# Patient Record
Sex: Male | Born: 1965 | Race: Black or African American | Hispanic: No | Marital: Single | Smoking: Light tobacco smoker
Health system: Southern US, Community
[De-identification: ages and names within clinical notes are randomized; demographics above are authoritative.]

## PROBLEM LIST (undated history)

## (undated) DIAGNOSIS — J45909 Unspecified asthma, uncomplicated: Secondary | ICD-10-CM

## (undated) DIAGNOSIS — N200 Calculus of kidney: Secondary | ICD-10-CM

## (undated) DIAGNOSIS — T7840XA Allergy, unspecified, initial encounter: Secondary | ICD-10-CM

## (undated) HISTORY — PX: LITHOTRIPSY: SUR834

## (undated) HISTORY — DX: Allergy, unspecified, initial encounter: T78.40XA

## (undated) HISTORY — PX: APPENDECTOMY: SHX54

## (undated) HISTORY — PX: ACHILLES TENDON SURGERY: SHX542

---

## 2010-07-25 ENCOUNTER — Emergency Department (HOSPITAL_COMMUNITY)
Admission: EM | Admit: 2010-07-25 | Discharge: 2010-07-25 | Disposition: A | Discharge status: Home / Self Care | Payer: BC Managed Care – PPO | Attending: Emergency Medicine | Admitting: Emergency Medicine

## 2010-07-25 DIAGNOSIS — Z202 Contact with and (suspected) exposure to infections with a predominantly sexual mode of transmission: Secondary | ICD-10-CM | POA: Insufficient documentation

## 2010-07-25 LAB — URINALYSIS, ROUTINE W REFLEX MICROSCOPIC
Bilirubin Urine: NEGATIVE
Hgb urine dipstick: NEGATIVE
Ketones, ur: NEGATIVE mg/dL
Nitrite: NEGATIVE
Urobilinogen, UA: 1 mg/dL (ref 0.0–1.0)

## 2010-07-25 LAB — URINE MICROSCOPIC-ADD ON

## 2010-07-28 LAB — GC/CHLAMYDIA PROBE AMP, URINE
Chlamydia, Swab/Urine, PCR: NEGATIVE
GC Probe Amp, Urine: NEGATIVE

## 2013-05-22 ENCOUNTER — Encounter: Payer: Self-pay | Admitting: Podiatrist

## 2013-05-22 ENCOUNTER — Ambulatory Visit (INDEPENDENT_AMBULATORY_CARE_PROVIDER_SITE_OTHER): Payer: PRIVATE HEALTH INSURANCE | Admitting: Podiatrist

## 2013-05-22 VITALS — BP 126/85 | HR 76 | Resp 17 | Ht 69.0 in | Wt 185.0 lb

## 2013-05-22 DIAGNOSIS — B351 Tinea unguium: Secondary | ICD-10-CM

## 2013-05-22 MED ORDER — TERBINAFINE HCL 250 MG PO TABS: 250.0000 mg | ORAL_TABLET | Freq: Every day | ORAL | Status: DC

## 2013-05-22 NOTE — Progress Notes (Signed)
   Subjective:    Patient ID: David Villarreal, male    DOB: 1965-06-03, 48 y.o.   MRN: [id number] Pt states has fungal toenails quit taking the medication when began to see improvement.  Pt has a corn to the left 5th toe, he states has been there since wearing improper shoes.  Pt has a hard painful callous to the bottom of his right 5th MPJ. HPI    Review of Systems  All other systems reviewed and are negative.       Objective:   Physical Exam GENERAL APPEARANCE: Alert, conversant. Appropriately groomed. No acute distress.  VASCULAR: Pedal pulses palpable at 2/4 DP and PT bilateral.  Capillary refill time is immediate to all digits,  Proximal to distal cooling it warm to warm.  Digital hair growth is present bilateral  NEUROLOGIC: sensation is intact epicritically and protectively to 5.07 monofilament at 5/5 sites bilateral.  Light touch is intact bilateral, vibratory sensation intact bilateral, achilles tendon reflex is intact bilateral.  MUSCULOSKELETAL: acceptable muscle strength, tone and stability bilateral.  Intrinsic muscluature intact bilateral.  Rectus appearance of foot and digits noted bilateral.   DERMATOLOGIC: Toenails one and 2 of bilateral feet are thickened, discolored, dystrophic, clinically mycotic, and uncomfortable. The patient relates that the toenail fungus completely resolved after the use of Lamisil in the past however he cannot recall if he took 30 days or 90 base of the medication. He also has a hyperkeratotic lesion submetatarsal 5 of the right foot.    Assessment & Plan:  Onychomycosis digits one and 2 bilateral   Plan: Recommended Lamisil again. He states he recently had his labs done and he will have the results faxed over. We will call when is safe for him to start taking the Lamisil again. I will see him back in 90 days for recheck and reevaluation of the toenail.

## 2013-05-22 NOTE — Patient Instructions (Signed)

## 2013-08-27 ENCOUNTER — Encounter: Payer: Self-pay | Admitting: Podiatrist

## 2013-08-27 ENCOUNTER — Ambulatory Visit (INDEPENDENT_AMBULATORY_CARE_PROVIDER_SITE_OTHER): Payer: PRIVATE HEALTH INSURANCE | Admitting: Podiatrist

## 2013-08-27 VITALS — BP 134/91 | HR 76 | Resp 18

## 2013-08-27 DIAGNOSIS — Z79899 Other long term (current) drug therapy: Secondary | ICD-10-CM

## 2013-08-27 DIAGNOSIS — B351 Tinea unguium: Secondary | ICD-10-CM

## 2013-08-27 MED ORDER — TERBINAFINE HCL 250 MG PO TABS: 250.0000 mg | ORAL_TABLET | Freq: Every day | ORAL | Status: AC

## 2013-08-27 NOTE — Patient Instructions (Signed)
Take the lamisil tabs for 1 more month longer-- watch the toenails as they grow and if they are not clear in 3-6 months, call and let me know.  They take a full year to grow out completely before we know for sure if the medication worked.

## 2013-08-27 NOTE — Progress Notes (Signed)
I DON'T THINK THAT THE LAMISIL IS WORKING AS GOOD BUT THEY ARE BETTER THAN THEY WERE  Patient presents for follow up of fungal nails 1,2 bilateral.  He has taken lamisil with some noticeable improvement.  He has only been on the medication for 3 months but states he's noticed improvement. He also has pain on left fifth digit from a corn  Objective: Neurovascular status is intact. Digital nails 1 into bilateral to appear to be growing out at the very proximal margin of the nail. Left fifth digit does have a digital corn which is symptomatic and painful to the patient.  Assessment: Onychomycosis, corn fifth toe left  Plan: Recommended continuation of Lamisil for one extra month. Labs were ordered and given to the patient to have done today. Also discussed the corn on the fifth digit left dispensed a pad in told him that if this does not help with the pain surgery would be recommended to straighten out the toe. He will call of the toenails do not improve in the next 3-6 months.

## 2013-08-28 LAB — HEPATIC FUNCTION PANEL
ALBUMIN: 4.5 g/dL (ref 3.5–5.5)
ALK PHOS: 68 IU/L (ref 39–117)
ALT: 12 IU/L (ref 0–44)
AST: 15 IU/L (ref 0–40)
Bilirubin, Direct: 0.07 mg/dL (ref 0.00–0.40)
Total Bilirubin: 0.3 mg/dL (ref 0.0–1.2)
Total Protein: 6.6 g/dL (ref 6.0–8.5)

## 2013-08-29 ENCOUNTER — Telehealth: Payer: Self-pay | Admitting: *Deleted

## 2013-08-29 NOTE — Telephone Encounter (Signed)
Message copied by Emilia BeckPARRY, MELODY A on Wed Aug 29, 2013  1:49 PM ------      Message from: Delories HeinzEGERTON, KATHRYN P      Created: Wed Aug 29, 2013  1:03 PM      Regarding: labs great-- finish out lamisil       Please tell him the labs are great-- ok to finish lamisil.      thanks      ----- Message -----         From: [facility] Lab Results In Interface         Sent: 08/28/2013   5:42 AM           To: Marlowe AschoffKathryn Egerton, DPM                   ------

## 2013-09-04 ENCOUNTER — Ambulatory Visit: Payer: PRIVATE HEALTH INSURANCE | Admitting: Podiatrist

## 2014-06-06 ENCOUNTER — Other Ambulatory Visit: Payer: Self-pay | Admitting: Orthopedic Surgery

## 2014-06-12 ENCOUNTER — Encounter (HOSPITAL_COMMUNITY): Payer: Self-pay | Admitting: *Deleted

## 2014-06-12 MED ORDER — POVIDONE-IODINE 7.5 % EX SOLN
Freq: Once | CUTANEOUS | Status: DC
  Filled 2014-06-12: qty 118

## 2014-06-12 MED ORDER — CEFAZOLIN SODIUM-DEXTROSE 2-3 GM-% IV SOLR
2.0000 g | INTRAVENOUS | Status: AC
  Administered 2014-06-13 (×2): 2 g via INTRAVENOUS

## 2014-06-12 NOTE — H&P (Signed)
     PREOPERATIVE H&P  Chief Complaint: Right leg pain  HPI: David Villarreal is a 49 y.o. male who presents with ongoing pain in the right leg  MRI reveals severe stenosis at L5/S1 as well as a pars defect  Patient has failed multiple forms of conservative care and continues to have pain (see office notes for additional details regarding the patient's full course of treatment)  Past Medical History  Diagnosis Date  . Allergy   . Stone in kidney   . Asthma     PMH; as a child only   Past Surgical History  Procedure Laterality Date  . Appendectomy    . Achilles tendon surgery Left   . Lithotripsy     History   Social History  . Marital Status: Single    Spouse Name: N/A  . Number of Children: N/A  . Years of Education: N/A   Social History Main Topics  . Smoking status: Light Tobacco Smoker -- 0.25 packs/day    Types: Cigarettes  . Smokeless tobacco: Not on file  . Alcohol Use: No  . Drug Use: No  . Sexual Activity: Not on file   Other Topics Concern  . None   Social History Narrative   Family History  Problem Relation Age of Onset  . Hypertension Mother   . Asthma Mother   . Lupus Mother   . Hypertension Father    No Known Allergies Prior to Admission medications   Medication Sig Start Date End Date Taking? Authorizing Provider  LYRICA 50 MG capsule Take 1 capsule by mouth 2 (two) times daily. 05/10/14  Yes Historical Provider, MD  terbinafine (LAMISIL) 250 MG tablet Take 1 tablet (250 mg total) by mouth daily. Patient not taking: Reported on 06/10/2014 08/27/13   Delories HeinzKathryn P Egerton, DPM     All other systems have been reviewed and were otherwise negative with the exception of those mentioned in the HPI and as above.  Physical Exam: There were no vitals filed for this visit.  General: Alert, no acute distress Cardiovascular: No pedal edema Respiratory: No cyanosis, no use of accessory musculature Skin: No lesions in the area of chief complaint Neurologic:  Sensation intact distally Psychiatric: Patient is competent for consent with normal mood and affect Lymphatic: No axillary or cervical lymphadenopathy  MUSCULOSKELETAL: + SLR on right  Assessment/Plan: Right leg pain Plan for Procedure(s): POSTERIOR LUMBAR FUSION AND DECOMPRESSION 1 LEVEL   Emilee HeroUMONSKI,Larsen Zettel LEONARD, MD 06/12/2014 9:20 PM

## 2014-06-12 NOTE — Progress Notes (Signed)
Pt denies SOB, chest pain, and being under the care of a cardiologist. Pt denies having a stress test, echo and cardiac cath. Pt denies having a chest x ray and EKG within the last year. Pt made aware to stop Aspirin, otc vitamins, NSAIDs and herbal medications. Pt verbalized understanding of all pre-op instructions.

## 2014-06-13 ENCOUNTER — Inpatient Hospital Stay (HOSPITAL_COMMUNITY)
Admission: RE | Admit: 2014-06-13 | Discharge: 2014-06-17 | DRG: 460 | Disposition: A | Discharge status: Home Health | Payer: Worker's Compensation | Source: Ambulatory Visit | Attending: Orthopedic Surgery | Admitting: Orthopedic Surgery

## 2014-06-13 ENCOUNTER — Encounter (HOSPITAL_COMMUNITY): Payer: Self-pay | Admitting: *Deleted

## 2014-06-13 ENCOUNTER — Encounter (HOSPITAL_COMMUNITY)
Admission: RE | Disposition: A | Discharge status: Home Health | Payer: Worker's Compensation | Source: Ambulatory Visit | Attending: Orthopedic Surgery

## 2014-06-13 ENCOUNTER — Inpatient Hospital Stay (HOSPITAL_COMMUNITY): Payer: Worker's Compensation

## 2014-06-13 ENCOUNTER — Other Ambulatory Visit: Payer: Self-pay

## 2014-06-13 ENCOUNTER — Inpatient Hospital Stay (HOSPITAL_COMMUNITY): Payer: Worker's Compensation | Admitting: Anesthesiology

## 2014-06-13 DIAGNOSIS — Z79899 Other long term (current) drug therapy: Secondary | ICD-10-CM | POA: Diagnosis not present

## 2014-06-13 DIAGNOSIS — M5416 Radiculopathy, lumbar region: Secondary | ICD-10-CM | POA: Diagnosis present

## 2014-06-13 DIAGNOSIS — M4807 Spinal stenosis, lumbosacral region: Secondary | ICD-10-CM | POA: Diagnosis present

## 2014-06-13 DIAGNOSIS — M79604 Pain in right leg: Secondary | ICD-10-CM | POA: Diagnosis present

## 2014-06-13 DIAGNOSIS — M5410 Radiculopathy, site unspecified: Secondary | ICD-10-CM | POA: Diagnosis present

## 2014-06-13 DIAGNOSIS — Z419 Encounter for procedure for purposes other than remedying health state, unspecified: Secondary | ICD-10-CM

## 2014-06-13 DIAGNOSIS — Z01818 Encounter for other preprocedural examination: Secondary | ICD-10-CM

## 2014-06-13 DIAGNOSIS — F17210 Nicotine dependence, cigarettes, uncomplicated: Secondary | ICD-10-CM | POA: Diagnosis present

## 2014-06-13 DIAGNOSIS — M4317 Spondylolisthesis, lumbosacral region: Principal | ICD-10-CM | POA: Diagnosis present

## 2014-06-13 HISTORY — DX: Unspecified asthma, uncomplicated: J45.909

## 2014-06-13 HISTORY — DX: Calculus of kidney: N20.0

## 2014-06-13 LAB — URINALYSIS, ROUTINE W REFLEX MICROSCOPIC
Bilirubin Urine: NEGATIVE
GLUCOSE, UA: NEGATIVE mg/dL
HGB URINE DIPSTICK: NEGATIVE
KETONES UR: NEGATIVE mg/dL
Leukocytes, UA: NEGATIVE
Nitrite: NEGATIVE
PROTEIN: NEGATIVE mg/dL
Specific Gravity, Urine: 1.021 (ref 1.005–1.030)
Urobilinogen, UA: 1 mg/dL (ref 0.0–1.0)
pH: 7 (ref 5.0–8.0)

## 2014-06-13 LAB — COMPREHENSIVE METABOLIC PANEL
ALK PHOS: 63 U/L (ref 39–117)
ALT: 16 U/L (ref 0–53)
AST: 17 U/L (ref 0–37)
Albumin: 3.7 g/dL (ref 3.5–5.2)
Anion gap: 11 (ref 5–15)
BUN: 8 mg/dL (ref 6–23)
CO2: 22 mmol/L (ref 19–32)
Calcium: 8.9 mg/dL (ref 8.4–10.5)
Chloride: 106 mmol/L (ref 96–112)
Creatinine, Ser: 0.89 mg/dL (ref 0.50–1.35)
GFR calc non Af Amer: >90 mL/min (ref >90)
Glucose, Bld: 93 mg/dL (ref 70–99)
POTASSIUM: 3.9 mmol/L (ref 3.5–5.1)
SODIUM: 139 mmol/L (ref 135–145)
TOTAL PROTEIN: 6.6 g/dL (ref 6.0–8.3)
Total Bilirubin: 0.6 mg/dL (ref 0.3–1.2)

## 2014-06-13 LAB — CBC WITH DIFFERENTIAL/PLATELET
Basophils Absolute: 0 10*3/uL (ref 0.0–0.1)
Basophils Relative: 0 % (ref 0–1)
EOS ABS: 0.1 10*3/uL (ref 0.0–0.7)
EOS PCT: 1 % (ref 0–5)
HEMATOCRIT: 40.5 % (ref 39.0–52.0)
Hemoglobin: 14.1 g/dL (ref 13.0–17.0)
LYMPHS ABS: 2.1 10*3/uL (ref 0.7–4.0)
Lymphocytes Relative: 30 % (ref 12–46)
MCH: 31.5 pg (ref 26.0–34.0)
MCHC: 34.8 g/dL (ref 30.0–36.0)
MCV: 90.6 fL (ref 78.0–100.0)
MONO ABS: 0.6 10*3/uL (ref 0.1–1.0)
MONOS PCT: 9 % (ref 3–12)
Neutro Abs: 4.2 10*3/uL (ref 1.7–7.7)
Neutrophils Relative %: 60 % (ref 43–77)
Platelets: 198 10*3/uL (ref 150–400)
RBC: 4.47 MIL/uL (ref 4.22–5.81)
RDW: 13.1 % (ref 11.5–15.5)
WBC: 6.9 10*3/uL (ref 4.0–10.5)

## 2014-06-13 LAB — SURGICAL PCR SCREEN
MRSA, PCR: NEGATIVE
STAPHYLOCOCCUS AUREUS: NEGATIVE

## 2014-06-13 LAB — ABO/RH: ABO/RH(D): O POS

## 2014-06-13 LAB — TYPE AND SCREEN
ABO/RH(D): O POS
Antibody Screen: NEGATIVE

## 2014-06-13 LAB — PROTIME-INR
INR: 1.05 (ref 0.00–1.49)
PROTHROMBIN TIME: 13.8 s (ref 11.6–15.2)

## 2014-06-13 LAB — APTT: aPTT: 35 seconds (ref 24–37)

## 2014-06-13 SURGERY — POSTERIOR LUMBAR FUSION 1 LEVEL
Anesthesia: General | Site: Back | Laterality: Right

## 2014-06-13 MED ORDER — MUPIROCIN 2 % EX OINT
TOPICAL_OINTMENT | CUTANEOUS | Status: AC
Start: 2014-06-13 — End: 2014-06-13
  Filled 2014-06-13: qty 22

## 2014-06-13 MED ORDER — METHYLENE BLUE 1 % INJ SOLN
INTRAMUSCULAR | Status: AC
  Filled 2014-06-13: qty 10

## 2014-06-13 MED ORDER — SENNOSIDES-DOCUSATE SODIUM 8.6-50 MG PO TABS: 1.0000 | ORAL_TABLET | Freq: Every evening | ORAL | Status: DC | PRN

## 2014-06-13 MED ORDER — NALOXONE HCL 0.4 MG/ML IJ SOLN: 0.4000 mg | INTRAMUSCULAR | Status: DC | PRN

## 2014-06-13 MED ORDER — LACTATED RINGERS IV SOLN
INTRAVENOUS | Status: DC
  Administered 2014-06-13 (×3): via INTRAVENOUS

## 2014-06-13 MED ORDER — MORPHINE SULFATE (PF) 1 MG/ML IV SOLN
INTRAVENOUS | Status: AC
  Administered 2014-06-14: 4 mL
  Filled 2014-06-13: qty 25

## 2014-06-13 MED ORDER — MORPHINE SULFATE 2 MG/ML IJ SOLN
1.0000 mg | INTRAMUSCULAR | Status: DC | PRN
  Administered 2014-06-14 – 2014-06-17 (×9): 2 mg via INTRAVENOUS
  Filled 2014-06-13 (×9): qty 1

## 2014-06-13 MED ORDER — 0.9 % SODIUM CHLORIDE (POUR BTL) OPTIME
TOPICAL | Status: DC | PRN
  Administered 2014-06-13: 2000 mL

## 2014-06-13 MED ORDER — PROPOFOL 10 MG/ML IV BOLUS
INTRAVENOUS | Status: DC | PRN
  Administered 2014-06-13: 200 mg via INTRAVENOUS

## 2014-06-13 MED ORDER — FENTANYL CITRATE 0.05 MG/ML IJ SOLN
INTRAMUSCULAR | Status: DC | PRN
  Administered 2014-06-13 (×4): 50 ug via INTRAVENOUS
  Administered 2014-06-13 (×2): 100 ug via INTRAVENOUS
  Administered 2014-06-13 (×2): 50 ug via INTRAVENOUS

## 2014-06-13 MED ORDER — SUCCINYLCHOLINE CHLORIDE 20 MG/ML IJ SOLN
INTRAMUSCULAR | Status: DC | PRN
  Administered 2014-06-13: 100 mg via INTRAVENOUS

## 2014-06-13 MED ORDER — HYDROMORPHONE HCL 1 MG/ML IJ SOLN
0.2500 mg | INTRAMUSCULAR | Status: DC | PRN
  Administered 2014-06-13 (×3): 0.5 mg via INTRAVENOUS

## 2014-06-13 MED ORDER — FENTANYL CITRATE 0.05 MG/ML IJ SOLN
INTRAMUSCULAR | Status: AC
  Filled 2014-06-13: qty 5

## 2014-06-13 MED ORDER — SUCCINYLCHOLINE CHLORIDE 20 MG/ML IJ SOLN
INTRAMUSCULAR | Status: AC
  Filled 2014-06-13: qty 1

## 2014-06-13 MED ORDER — DIPHENHYDRAMINE HCL 12.5 MG/5ML PO ELIX: 12.5000 mg | ORAL_SOLUTION | Freq: Four times a day (QID) | ORAL | Status: DC | PRN

## 2014-06-13 MED ORDER — MORPHINE SULFATE (PF) 1 MG/ML IV SOLN
INTRAVENOUS | Status: DC
  Administered 2014-06-13 – 2014-06-14 (×2): via INTRAVENOUS
  Administered 2014-06-14: 4 mL via INTRAVENOUS
  Administered 2014-06-14: 8 mg via INTRAVENOUS
  Administered 2014-06-14: 32.7 mg via INTRAVENOUS
  Administered 2014-06-14: 08:00:00 via INTRAVENOUS
  Filled 2014-06-13 (×2): qty 25

## 2014-06-13 MED ORDER — DIAZEPAM 5 MG PO TABS
5.0000 mg | ORAL_TABLET | Freq: Four times a day (QID) | ORAL | Status: DC | PRN
  Administered 2014-06-14 – 2014-06-17 (×9): 5 mg via ORAL
  Filled 2014-06-13 (×9): qty 1

## 2014-06-13 MED ORDER — THROMBIN 20000 UNITS EX KIT
PACK | CUTANEOUS | Status: DC | PRN
  Administered 2014-06-13: 20000 [IU] via TOPICAL

## 2014-06-13 MED ORDER — PROPOFOL INFUSION 10 MG/ML OPTIME
INTRAVENOUS | Status: DC | PRN
  Administered 2014-06-13: 75 ug/kg/min via INTRAVENOUS

## 2014-06-13 MED ORDER — HYDROMORPHONE HCL 1 MG/ML IJ SOLN
INTRAMUSCULAR | Status: AC
  Filled 2014-06-13: qty 1

## 2014-06-13 MED ORDER — SODIUM CHLORIDE 0.9 % IJ SOLN
3.0000 mL | Freq: Two times a day (BID) | INTRAMUSCULAR | Status: DC
  Administered 2014-06-15 – 2014-06-17 (×5): 3 mL via INTRAVENOUS

## 2014-06-13 MED ORDER — ONDANSETRON HCL 4 MG/2ML IJ SOLN: 4.0000 mg | Freq: Four times a day (QID) | INTRAMUSCULAR | Status: DC | PRN

## 2014-06-13 MED ORDER — THROMBIN 20000 UNITS EX SOLR
CUTANEOUS | Status: AC
  Filled 2014-06-13: qty 20000

## 2014-06-13 MED ORDER — ACETAMINOPHEN 325 MG PO TABS: 325.0000 mg | ORAL_TABLET | ORAL | Status: DC | PRN

## 2014-06-13 MED ORDER — LACTATED RINGERS IV SOLN: INTRAVENOUS | Status: DC | PRN

## 2014-06-13 MED ORDER — DIAZEPAM 5 MG PO TABS: 5.0000 mg | ORAL_TABLET | Freq: Four times a day (QID) | ORAL | Status: DC | PRN

## 2014-06-13 MED ORDER — SODIUM CHLORIDE 0.9 % IV SOLN
88.0000 mg | INTRAVENOUS | Status: DC | PRN
  Administered 2014-06-13: 88 mg via INTRAVENOUS

## 2014-06-13 MED ORDER — VECURONIUM BROMIDE 10 MG IV SOLR
INTRAVENOUS | Status: DC | PRN
  Administered 2014-06-13 (×2): 2 mg via INTRAVENOUS
  Administered 2014-06-13: 6 mg via INTRAVENOUS

## 2014-06-13 MED ORDER — CEFAZOLIN SODIUM 1-5 GM-% IV SOLN
1.0000 g | Freq: Three times a day (TID) | INTRAVENOUS | Status: AC
  Administered 2014-06-14 (×2): 1 g via INTRAVENOUS
  Filled 2014-06-13 (×2): qty 50

## 2014-06-13 MED ORDER — HEMOSTATIC AGENTS (NO CHARGE) OPTIME
TOPICAL | Status: DC | PRN
  Administered 2014-06-13: 1 via TOPICAL

## 2014-06-13 MED ORDER — OXYCODONE HCL 5 MG PO TABS
5.0000 mg | ORAL_TABLET | Freq: Once | ORAL | Status: AC | PRN
  Administered 2014-06-13: 5 mg via ORAL

## 2014-06-13 MED ORDER — OXYCODONE HCL 5 MG/5ML PO SOLN: 5.0000 mg | Freq: Once | ORAL | Status: AC | PRN

## 2014-06-13 MED ORDER — DIPHENHYDRAMINE HCL 50 MG/ML IJ SOLN: 12.5000 mg | Freq: Four times a day (QID) | INTRAMUSCULAR | Status: DC | PRN

## 2014-06-13 MED ORDER — MENTHOL 3 MG MT LOZG: 1.0000 | LOZENGE | OROMUCOSAL | Status: DC | PRN

## 2014-06-13 MED ORDER — PHENOL 1.4 % MT LIQD
1.0000 | OROMUCOSAL | Status: DC | PRN
  Filled 2014-06-13: qty 177

## 2014-06-13 MED ORDER — LACTATED RINGERS IV SOLN
INTRAVENOUS | Status: DC | PRN
  Administered 2014-06-13: 14:00:00 via INTRAVENOUS

## 2014-06-13 MED ORDER — ACETAMINOPHEN 10 MG/ML IV SOLN
1000.0000 mg | Freq: Once | INTRAVENOUS | Status: AC
  Administered 2014-06-13: 1000 mg via INTRAVENOUS
  Filled 2014-06-13: qty 100

## 2014-06-13 MED ORDER — ALUM & MAG HYDROXIDE-SIMETH 200-200-20 MG/5ML PO SUSP: 30.0000 mL | Freq: Four times a day (QID) | ORAL | Status: DC | PRN

## 2014-06-13 MED ORDER — METHYLPREDNISOLONE ACETATE 40 MG/ML IJ SUSP
INTRAMUSCULAR | Status: AC
  Filled 2014-06-13: qty 1

## 2014-06-13 MED ORDER — FLEET ENEMA 7-19 GM/118ML RE ENEM: 1.0000 | ENEMA | Freq: Once | RECTAL | Status: AC | PRN

## 2014-06-13 MED ORDER — OXYCODONE HCL 5 MG PO TABS
ORAL_TABLET | ORAL | Status: AC
  Filled 2014-06-13: qty 1

## 2014-06-13 MED ORDER — SODIUM CHLORIDE 0.9 % IJ SOLN: 3.0000 mL | INTRAMUSCULAR | Status: DC | PRN

## 2014-06-13 MED ORDER — SODIUM CHLORIDE 0.9 % IJ SOLN: 9.0000 mL | INTRAMUSCULAR | Status: DC | PRN

## 2014-06-13 MED ORDER — VECURONIUM BROMIDE 10 MG IV SOLR
INTRAVENOUS | Status: AC
  Filled 2014-06-13: qty 10

## 2014-06-13 MED ORDER — MUPIROCIN 2 % EX OINT
1.0000 "application " | TOPICAL_OINTMENT | Freq: Once | CUTANEOUS | Status: AC
  Administered 2014-06-13: 1 via TOPICAL

## 2014-06-13 MED ORDER — PROPOFOL 10 MG/ML IV BOLUS
INTRAVENOUS | Status: AC
  Filled 2014-06-13: qty 20

## 2014-06-13 MED ORDER — LIDOCAINE HCL (CARDIAC) 20 MG/ML IV SOLN
INTRAVENOUS | Status: DC | PRN
  Administered 2014-06-13: 40 mg via INTRAVENOUS

## 2014-06-13 MED ORDER — SODIUM CHLORIDE 0.9 % IV SOLN
INTRAVENOUS | Status: DC
  Administered 2014-06-13: via INTRAVENOUS

## 2014-06-13 MED ORDER — MUPIROCIN 2 % EX OINT: 1.0000 "application " | TOPICAL_OINTMENT | Freq: Once | CUTANEOUS | Status: DC

## 2014-06-13 MED ORDER — BISACODYL 5 MG PO TBEC
5.0000 mg | DELAYED_RELEASE_TABLET | Freq: Every day | ORAL | Status: DC | PRN
  Administered 2014-06-16 – 2014-06-17 (×2): 5 mg via ORAL
  Filled 2014-06-13 (×2): qty 1

## 2014-06-13 MED ORDER — ACETAMINOPHEN 325 MG PO TABS
650.0000 mg | ORAL_TABLET | ORAL | Status: DC | PRN
  Administered 2014-06-15: 650 mg via ORAL
  Filled 2014-06-13: qty 2

## 2014-06-13 MED ORDER — ACETAMINOPHEN 160 MG/5ML PO SOLN: 325.0000 mg | ORAL | Status: DC | PRN

## 2014-06-13 MED ORDER — ONDANSETRON HCL 4 MG/2ML IJ SOLN
4.0000 mg | INTRAMUSCULAR | Status: DC | PRN
  Administered 2014-06-15: 4 mg via INTRAVENOUS
  Filled 2014-06-13: qty 2

## 2014-06-13 MED ORDER — BUPIVACAINE-EPINEPHRINE 0.25% -1:200000 IJ SOLN
INTRAMUSCULAR | Status: DC | PRN
  Administered 2014-06-13: 30 mL

## 2014-06-13 MED ORDER — BUPIVACAINE-EPINEPHRINE (PF) 0.25% -1:200000 IJ SOLN
INTRAMUSCULAR | Status: AC
  Filled 2014-06-13: qty 30

## 2014-06-13 MED ORDER — MIDAZOLAM HCL 2 MG/2ML IJ SOLN
INTRAMUSCULAR | Status: AC
  Filled 2014-06-13: qty 2

## 2014-06-13 MED ORDER — STERILE WATER FOR INJECTION IJ SOLN
INTRAMUSCULAR | Status: AC
  Filled 2014-06-13: qty 10

## 2014-06-13 MED ORDER — ARTIFICIAL TEARS OP OINT
TOPICAL_OINTMENT | OPHTHALMIC | Status: AC
  Filled 2014-06-13: qty 3.5

## 2014-06-13 MED ORDER — PNEUMOCOCCAL VAC POLYVALENT 25 MCG/0.5ML IJ INJ
0.5000 mL | INJECTION | INTRAMUSCULAR | Status: AC
  Administered 2014-06-15: 0.5 mL via INTRAMUSCULAR

## 2014-06-13 MED ORDER — ARTIFICIAL TEARS OP OINT
TOPICAL_OINTMENT | OPHTHALMIC | Status: DC | PRN
  Administered 2014-06-13: 1 via OPHTHALMIC

## 2014-06-13 MED ORDER — PREGABALIN 50 MG PO CAPS
50.0000 mg | ORAL_CAPSULE | Freq: Two times a day (BID) | ORAL | Status: DC
  Administered 2014-06-14 – 2014-06-17 (×7): 50 mg via ORAL
  Filled 2014-06-13 (×7): qty 1

## 2014-06-13 MED ORDER — OXYCODONE-ACETAMINOPHEN 5-325 MG PO TABS: 1.0000 | ORAL_TABLET | ORAL | Status: DC | PRN

## 2014-06-13 MED ORDER — ACETAMINOPHEN 650 MG RE SUPP: 650.0000 mg | RECTAL | Status: DC | PRN

## 2014-06-13 MED ORDER — DOCUSATE SODIUM 100 MG PO CAPS
100.0000 mg | ORAL_CAPSULE | Freq: Two times a day (BID) | ORAL | Status: DC
  Administered 2014-06-13 – 2014-06-17 (×8): 100 mg via ORAL
  Filled 2014-06-13 (×7): qty 1

## 2014-06-13 MED ORDER — MIDAZOLAM HCL 5 MG/5ML IJ SOLN
INTRAMUSCULAR | Status: DC | PRN
  Administered 2014-06-13: 2 mg via INTRAVENOUS

## 2014-06-13 MED ORDER — CEFAZOLIN SODIUM-DEXTROSE 2-3 GM-% IV SOLR
INTRAVENOUS | Status: AC
  Filled 2014-06-13: qty 50

## 2014-06-13 MED ORDER — SODIUM CHLORIDE 0.9 % IV SOLN
250.0000 mL | INTRAVENOUS | Status: DC
Start: 2014-06-13 — End: 2014-06-17

## 2014-06-13 MED ORDER — METHYLPREDNISOLONE SODIUM SUCC 40 MG IJ SOLR
INTRAMUSCULAR | Status: AC
  Filled 2014-06-13: qty 2

## 2014-06-13 MED ORDER — LIDOCAINE HCL (CARDIAC) 20 MG/ML IV SOLN
INTRAVENOUS | Status: AC
  Filled 2014-06-13: qty 5

## 2014-06-13 SURGICAL SUPPLY — 86 items
BENZOIN TINCTURE PRP APPL 2/3 (GAUZE/BANDAGES/DRESSINGS) ×3 IMPLANT
BLADE SURG ROTATE 9660 (MISCELLANEOUS) IMPLANT
BUR PRESCISION 1.7 ELITE (BURR) ×3 IMPLANT
BUR ROUND PRECISION 4.0 (BURR) ×2 IMPLANT
BUR ROUND PRECISION 4.0MM (BURR) ×1
CAGE CONCORDE BULLET 9X9X23 (Cage) ×1 IMPLANT
CAGE CONCORDE BULLET 9X9X23MM (Cage) ×1 IMPLANT
CAGE SPNL PRLL BLT NOSE 23X9X9 (Cage) ×1 IMPLANT
CARTRIDGE OIL MAESTRO DRILL (MISCELLANEOUS) ×1 IMPLANT
CLOSURE STERI-STRIP 1/2X4 (GAUZE/BANDAGES/DRESSINGS) ×1
CLOSURE WOUND 1/2 X4 (GAUZE/BANDAGES/DRESSINGS) ×1
CLSR STERI-STRIP ANTIMIC 1/2X4 (GAUZE/BANDAGES/DRESSINGS) ×2 IMPLANT
CONT SPEC STER OR (MISCELLANEOUS) IMPLANT
COVER MAYO STAND STRL (DRAPES) ×6 IMPLANT
COVER SURGICAL LIGHT HANDLE (MISCELLANEOUS) ×3 IMPLANT
DIFFUSER DRILL AIR PNEUMATIC (MISCELLANEOUS) ×3 IMPLANT
DRAIN CHANNEL 15F RND FF W/TCR (WOUND CARE) IMPLANT
DRAPE C-ARM 42X72 X-RAY (DRAPES) ×3 IMPLANT
DRAPE C-ARMOR (DRAPES) ×3 IMPLANT
DRAPE POUCH INSTRU U-SHP 10X18 (DRAPES) ×3 IMPLANT
DRAPE SURG 17X23 STRL (DRAPES) ×12 IMPLANT
DURAPREP 26ML APPLICATOR (WOUND CARE) ×3 IMPLANT
ELECT BLADE 4.0 EZ CLEAN MEGAD (MISCELLANEOUS) ×3
ELECT CAUTERY BLADE 6.4 (BLADE) ×3 IMPLANT
ELECT REM PT RETURN 9FT ADLT (ELECTROSURGICAL) ×3
ELECTRODE BLDE 4.0 EZ CLN MEGD (MISCELLANEOUS) ×1 IMPLANT
ELECTRODE REM PT RTRN 9FT ADLT (ELECTROSURGICAL) ×1 IMPLANT
EVACUATOR SILICONE 100CC (DRAIN) IMPLANT
GAUZE SPONGE 4X4 12PLY STRL (GAUZE/BANDAGES/DRESSINGS) ×3 IMPLANT
GAUZE SPONGE 4X4 16PLY XRAY LF (GAUZE/BANDAGES/DRESSINGS) ×12 IMPLANT
GLOVE BIO SURGEON STRL SZ7 (GLOVE) ×3 IMPLANT
GLOVE BIO SURGEON STRL SZ8 (GLOVE) ×3 IMPLANT
GLOVE BIOGEL PI IND STRL 7.0 (GLOVE) ×1 IMPLANT
GLOVE BIOGEL PI IND STRL 8 (GLOVE) ×1 IMPLANT
GLOVE BIOGEL PI INDICATOR 7.0 (GLOVE) ×2
GLOVE BIOGEL PI INDICATOR 8 (GLOVE) ×2
GOWN STRL REUS W/ TWL LRG LVL3 (GOWN DISPOSABLE) ×2 IMPLANT
GOWN STRL REUS W/ TWL XL LVL3 (GOWN DISPOSABLE) ×1 IMPLANT
GOWN STRL REUS W/TWL LRG LVL3 (GOWN DISPOSABLE) ×4
GOWN STRL REUS W/TWL XL LVL3 (GOWN DISPOSABLE) ×2
IV CATH 14GX2 1/4 (CATHETERS) ×3 IMPLANT
KIT BASIN OR (CUSTOM PROCEDURE TRAY) ×3 IMPLANT
KIT POSITION SURG JACKSON T1 (MISCELLANEOUS) ×3 IMPLANT
KIT ROOM TURNOVER OR (KITS) ×3 IMPLANT
MARKER SKIN DUAL TIP RULER LAB (MISCELLANEOUS) ×3 IMPLANT
MIX DBX 10CC 35% BONE (Bone Implant) ×3 IMPLANT
NDL SAFETY ECLIPSE 18X1.5 (NEEDLE) ×1 IMPLANT
NEEDLE 22X1 1/2 (OR ONLY) (NEEDLE) ×3 IMPLANT
NEEDLE BONE MARROW 8GX6 FENEST (NEEDLE) IMPLANT
NEEDLE HYPO 18GX1.5 SHARP (NEEDLE) ×2
NEEDLE HYPO 25GX1X1/2 BEV (NEEDLE) ×3 IMPLANT
NEEDLE SPNL 18GX3.5 QUINCKE PK (NEEDLE) ×6 IMPLANT
NEURO MONITORING STIM (LABOR (TRAVEL & OVERTIME)) ×3 IMPLANT
NS IRRIG 1000ML POUR BTL (IV SOLUTION) ×3 IMPLANT
OIL CARTRIDGE MAESTRO DRILL (MISCELLANEOUS) ×3
PACK LAMINECTOMY ORTHO (CUSTOM PROCEDURE TRAY) ×3 IMPLANT
PACK UNIVERSAL I (CUSTOM PROCEDURE TRAY) ×3 IMPLANT
PAD ARMBOARD 7.5X6 YLW CONV (MISCELLANEOUS) ×6 IMPLANT
PATTIES SURGICAL .5 X1 (DISPOSABLE) ×3 IMPLANT
PATTIES SURGICAL .5X1.5 (GAUZE/BANDAGES/DRESSINGS) ×3 IMPLANT
ROD PRE BENT EXP 40MM (Rod) ×3 IMPLANT
ROD PRE BENT EXPEDIUM 35MM (Rod) ×3 IMPLANT
SCREW EXPEDIUM POLYAXIAL 7X40M (Screw) ×6 IMPLANT
SCREW POLYAXIAL 7X45MM (Screw) ×6 IMPLANT
SCREW SET SINGLE INNER (Screw) ×15 IMPLANT
SPONGE GAUZE 4X4 12PLY STER LF (GAUZE/BANDAGES/DRESSINGS) ×3 IMPLANT
SPONGE INTESTINAL PEANUT (DISPOSABLE) ×3 IMPLANT
SPONGE SURGIFOAM ABS GEL 100 (HEMOSTASIS) ×3 IMPLANT
STRIP CLOSURE SKIN 1/2X4 (GAUZE/BANDAGES/DRESSINGS) ×2 IMPLANT
SURGIFLO TRUKIT (HEMOSTASIS) IMPLANT
SUT MNCRL AB 4-0 PS2 18 (SUTURE) ×3 IMPLANT
SUT VIC AB 0 CT1 18XCR BRD 8 (SUTURE) IMPLANT
SUT VIC AB 0 CT1 8-18 (SUTURE)
SUT VIC AB 1 CT1 18XCR BRD 8 (SUTURE) ×1 IMPLANT
SUT VIC AB 1 CT1 8-18 (SUTURE) ×2
SUT VIC AB 2-0 CT2 18 VCP726D (SUTURE) ×3 IMPLANT
SYR 20CC LL (SYRINGE) ×3 IMPLANT
SYR BULB IRRIGATION 50ML (SYRINGE) ×3 IMPLANT
SYR CONTROL 10ML LL (SYRINGE) ×6 IMPLANT
SYR TB 1ML LUER SLIP (SYRINGE) ×3 IMPLANT
TAPE CLOTH SURG 4X10 WHT LF (GAUZE/BANDAGES/DRESSINGS) ×3 IMPLANT
TOWEL OR 17X24 6PK STRL BLUE (TOWEL DISPOSABLE) ×3 IMPLANT
TOWEL OR 17X26 10 PK STRL BLUE (TOWEL DISPOSABLE) ×3 IMPLANT
TRAY FOLEY CATH 16FRSI W/METER (SET/KITS/TRAYS/PACK) ×3 IMPLANT
WATER STERILE IRR 1000ML POUR (IV SOLUTION) IMPLANT
YANKAUER SUCT BULB TIP NO VENT (SUCTIONS) ×3 IMPLANT

## 2014-06-13 NOTE — Anesthesia Postprocedure Evaluation (Signed)
  Anesthesia Post-op Note  Patient: Henrine ScrewsMarco Carico  Procedure(s) Performed: Procedure(s) with comments: POSTERIOR LUMBAR FUSION 1 LEVEL (Right) - Right sided lumbar 5-sacrum 1 transforaminal lumbar interbody fusion with instrumentation and allograft  Patient Location: PACU  Anesthesia Type:General  Level of Consciousness: awake, alert  and sedated  Airway and Oxygen Therapy: Patient Spontanous Breathing  Post-op Pain: mild  Post-op Assessment: Post-op Vital signs reviewed  Post-op Vital Signs: stable  Last Vitals:  Filed Vitals:   06/13/14 2000  BP: 127/81  Pulse: 74  Temp:   Resp: 15    Complications: No apparent anesthesia complications

## 2014-06-13 NOTE — Anesthesia Procedure Notes (Signed)
Procedure Name: Intubation Date/Time: 06/13/2014 2:11 PM Performed by: Lovie CholOCK, Donavyn Fecher K Pre-anesthesia Checklist: Patient identified, Emergency Drugs available, Suction available, Patient being monitored and Timeout performed Patient Re-evaluated:Patient Re-evaluated prior to inductionOxygen Delivery Method: Circle system utilized Preoxygenation: Pre-oxygenation with 100% oxygen Intubation Type: IV induction Ventilation: Mask ventilation without difficulty Laryngoscope Size: Miller and 2 Grade View: Grade II Tube type: Oral Tube size: 7.5 mm Number of attempts: 1 Airway Equipment and Method: Stylet Placement Confirmation: ETT inserted through vocal cords under direct vision,  positive ETCO2,  CO2 detector and breath sounds checked- equal and bilateral Secured at: 21 cm Tube secured with: Tape Dental Injury: Teeth and Oropharynx as per pre-operative assessment

## 2014-06-13 NOTE — Transfer of Care (Signed)
Immediate Anesthesia Transfer of Care Note  Patient: David Villarreal  Procedure(s) Performed: Procedure(s) with comments: POSTERIOR LUMBAR FUSION 1 LEVEL (Right) - Right sided lumbar 5-sacrum 1 transforaminal lumbar interbody fusion with instrumentation and allograft  Patient Location: PACU  Anesthesia Type:General  Level of Consciousness: awake and oriented  Airway & Oxygen Therapy: Patient Spontanous Breathing and Patient connected to nasal cannula oxygen  Post-op Assessment: Report given to RN, Post -op Vital signs reviewed and stable and Patient moving all extremities  Post vital signs: Reviewed and stable  Last Vitals:  Filed Vitals:   06/13/14 0921  BP: 134/92  Pulse: 73  Temp: 37 C  Resp: 16    Complications: No apparent anesthesia complications

## 2014-06-13 NOTE — Anesthesia Preprocedure Evaluation (Addendum)
Anesthesia Evaluation  Patient identified by MRN, date of birth, ID band Patient awake    Reviewed: Allergy & Precautions, NPO status , Patient's Chart, lab work & pertinent test results  History of Anesthesia Complications Negative for: history of anesthetic complications  Airway Mallampati: III  TM Distance: >3 FB Neck ROM: Full    Dental  (+) Teeth Intact, Dental Advisory Given   Pulmonary neg shortness of breath, neg sleep apnea, neg COPDneg recent URI, Current Smoker,  breath sounds clear to auscultation- rhonchi        Cardiovascular negative cardio ROS  Rhythm:Regular     Neuro/Psych  Neuromuscular disease negative psych ROS   GI/Hepatic negative GI ROS, Neg liver ROS,   Endo/Other  negative endocrine ROS  Renal/GU negative Renal ROS     Musculoskeletal   Abdominal   Peds  Hematology negative hematology ROS (+)   Anesthesia Other Findings   Reproductive/Obstetrics                            Anesthesia Physical Anesthesia Plan  ASA: II  Anesthesia Plan: General   Post-op Pain Management:    Induction: Intravenous  Airway Management Planned: Oral ETT  Additional Equipment: None  Intra-op Plan:   Post-operative Plan: Extubation in OR  Informed Consent: I have reviewed the patients History and Physical, chart, labs and discussed the procedure including the risks, benefits and alternatives for the proposed anesthesia with the patient or authorized representative who has indicated his/her understanding and acceptance.   Dental advisory given  Plan Discussed with: CRNA and Surgeon  Anesthesia Plan Comments:         Anesthesia Quick Evaluation

## 2014-06-14 MED ORDER — OXYCODONE HCL ER 20 MG PO T12A
20.0000 mg | EXTENDED_RELEASE_TABLET | Freq: Two times a day (BID) | ORAL | Status: DC
  Administered 2014-06-14 – 2014-06-16 (×5): 20 mg via ORAL
  Filled 2014-06-14 (×5): qty 1

## 2014-06-14 MED ORDER — OXYCODONE-ACETAMINOPHEN 5-325 MG PO TABS
1.0000 | ORAL_TABLET | ORAL | Status: DC | PRN
  Administered 2014-06-14 – 2014-06-17 (×14): 2 via ORAL
  Filled 2014-06-14 (×14): qty 2

## 2014-06-14 MED ORDER — METHOCARBAMOL 500 MG PO TABS
500.0000 mg | ORAL_TABLET | Freq: Four times a day (QID) | ORAL | Status: DC | PRN
  Administered 2014-06-14 – 2014-06-17 (×9): 500 mg via ORAL
  Filled 2014-06-14 (×9): qty 1

## 2014-06-14 MED FILL — Sodium Chloride IV Soln 0.9%: INTRAVENOUS | Qty: 1000 | Status: AC

## 2014-06-14 MED FILL — Thrombin For Soln 20000 Unit: CUTANEOUS | Qty: 1 | Status: AC

## 2014-06-14 MED FILL — Heparin Sodium (Porcine) Inj 1000 Unit/ML: INTRAMUSCULAR | Qty: 30 | Status: AC

## 2014-06-14 NOTE — Progress Notes (Signed)
    Patient doing well with resolved R leg pain. LBP as expected, was mildly controlled with PCA through the night, pt has been quite uncomfortable over past 2 hours, PCA not controlling axial pain, also complaining of neck pain and stiffness. Denies SOB, Ch Px, HA, N/V/D, N/T. Reports slow go with PT/OT yesterday   Physical Exam: BP 134/92 mmHg  Pulse 109  Temp(Src) 99.4 F (37.4 C) (Oral)  Resp 26  Ht 5' 8.5" (1.74 m)  Wt 87.998 kg (194 lb)  BMI 29.07 kg/m2  SpO2 96%   Dressing in place, has been reinforced, no active oozing appreciated. Pt in bed, SCD's in place NVI  POD #1 s/p L5-S1 decompression and fusion  - Resolved pre-op R leg pain - Expected PO LBP not well controlled on PCA - Neck pain, likely positional  - up with PT/OT, encourage ambulation  -Brace at all times when OOB except PM trips to bathroom  - D/C PCA - Percocet for pain, Valium for muscle spasms  -Will add Oxycontin 20mg  q12   -Will add robaxin PRN between valium doses for muscle px/tightness - likely d/c home sat vs Sunday pending px and PT/OT

## 2014-06-14 NOTE — Op Note (Signed)
NAMEELAD, MACPHAIL                 ACCOUNT NO.:  [id number]  MEDICAL RECORD NO.:  [id number]  LOCATION:  [id number]                        FACILITY:  [facility]  PHYSICIAN:  Estill Bamberg, MD      DATE OF BIRTH:  1965-10-14  DATE OF PROCEDURE:  06/13/2014                              OPERATIVE REPORT   PREOPERATIVE DIAGNOSES: 1. Bilateral L5 pars interarticularis defect. 2. High grade 1 L5-S1 spondylolisthesis. 3. Right-sided neural foraminal stenosis. 4. Right-sided L5 radiculopathy.  POSTOPERATIVE DIAGNOSES: 1. Bilateral L5 pars interarticularis defect. 2. High grade 1 L5-S1 spondylolisthesis. 3. Right-sided neural foraminal stenosis. 4. Right-sided L5 radiculopathy.  PROCEDURES: 1. Gill-type decompression with removal of the L5 Gill fragment to     decompress the bilateral L5 exiting nerves. 2. Right-sided L5-S1 transforaminal lumbar interbody fusion. 3. Left-sided L5-S1 posterolateral fusion. 4. Insertion of interbody device x1 (9 x 23-mm Concorde bullet cage). 5. Placement of posterior instrumentation (7-mm pedicle screws). 6. Use of local autograft. 7. Use of morselized allograft - DBX mix. 8. Intraoperative use of fluoroscopy.  SURGEON:  Estill Bamberg, MD  ASSISTANT:  Jason Coop, PA-C.  ANESTHESIA:  General endotracheal anesthesia.  COMPLICATIONS:  None.  DISPOSITION:  Stable.  ESTIMATED BLOOD LOSS:  200 mL.  INDICATIONS FOR SURGERY:  Briefly, Mr. 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  The patient did develop substantial pain in his right leg.  Radiographs did reveal a grade 1 L5-S1 spondylolisthesis and an MRI did reveal bilateral neural foraminal stenosis at the L5-S1 level.  The patient did fail multiple forms of conservative care, and therefore, we did discuss proceeding with the procedure noted  above.  The patient did elect to proceed.  OPERATIVE DETAILS:  On June 13, 2014, the patient was brought to Surgery and general endotracheal anesthesia was administered.  The patient was placed prone on a well-padded flat Jackson bed with a spinal frame. Antibiotics were given.  The back was prepped and draped and a time-out was performed.  A midline incision was then made.  The fascia was incised in the midline and the paraspinal musculature was bluntly swept laterally.  Using anatomic landmarks in addition to fluoroscopy, I did cannulate the L5 and S1 pedicles in the usual fashion.  I did use a 6-mm tap, which was proceeded by gearshift probe.  A ball-tip probe was used to confirm that there was no cortical violation of the pedicles.  Bone wax was placed in the cannulated pedicle holes.  I then performed a Gill- type laminectomy, removing the Gill fragment of the L5 level.  Next, I decorticated the posterior elements in the posterolateral gutter on the left side to help aid in the success of the fusion.  I then placed pedicle screws at L5 and at S1 on the left side.  A rod was placed and distraction was applied across the rod.  On the right side, I did perform a full facetectomy and I did fully and adequately decompressed the lateral recess as well as  the neural foramen on the right side.  I did identify the exiting L5 nerve and it was noted to be free of compression at the termination of the decompression.  Then, with an assistant holding medial retraction of the traversing S1 nerve, I did perform an annulotomy at the posterolateral aspect of the L5-S1 intervertebral disk.  I then used a series of curettes and pituitary rongeurs to complete the diskectomy.  The endplates were appropriately prepared.  I then liberally packed the intervertebral space with autograft and allograft.  I then packed the intervertebral spacer with autograft as well as the DBX mix.  The intervertebral spacer  was then tamped into position in the usual fashion.  I was very pleased with the press-fit of the implant.  I then released the distraction on the contralateral left side.  I then placed the pedicle screws at L5 and at S1.  The appropriate length rod was then secured into the Tulip heads of the screws.  Capps were then placed and the final locking procedure was performed bilaterally.  I was very pleased with the construct on both AP and lateral views.  Bone graft was then placed into the left posterolateral gutter to help aid in the fusion.  Of note, I did copiously irrigate the wound with approximately 2 liters of normal saline prior to placing the intervertebral bone graft.  Also of note, I did use neurologic monitoring throughout the surgery, and there was no sustained abnormal EMG activity noted throughout the entire surgery.  I did also test the right pedicle screws and there was no screw that tested below 15 milliamps.  The wound was then explored and there was no undue bleeding or extravasation of cerebrospinal fluid.  The wound was then closed in layers using #1 Vicryl followed by 2-0 Vicryl followed by 3-0 Monocryl.  Benzoin and Steri-Strips were applied followed by sterile dressing.  All instrument counts were correct at the termination of the procedure.  Of note, Jason CoopKayla McKenzie was my assistant throughout surgery, and did aid in retraction, suctioning, and closure.     Estill BambergMark Libby Goehring, MD     MD/MEDQ  D:  06/13/2014  T:  06/14/2014  Job:  [id number]

## 2014-06-14 NOTE — Evaluation (Signed)
Physical Therapy Evaluation Patient Details Name: David Villarreal MRN: [id number] DOB: [birth year] Today's Date: 06/14/2014   History of Present Illness  49 yo male s/p L5-s1 posterolateral fusion with pedicle screwes. PMH: kidney stones, appendectomy, L achilles tendon surg  Clinical Impression  Pt was able to get OOB to the recliner chair with RW and significant extra time and effort.  He is limited by significant low back and neck pain.  He is very guarded throughout his body and buckles over bil legs in standing and with gait.  He will likely need a few day acute stay to mobilize well enough to return home with RW and home therapy.   PT to follow acutely for deficits listed below.       Follow Up Recommendations Home health PT;Supervision/Assistance - 24 hour    Equipment Recommendations  Rolling walker with 5" wheels    Recommendations for Other Services   NA    Precautions / Restrictions Precautions Precautions: Back Precaution Booklet Issued: Yes (comment) Precaution Comments: handout given by OT, PT reviewed log roll to get up to EOB.  Required Braces or Orthoses: Spinal Brace Spinal Brace: Thoracolumbosacral orthotic;Applied in sitting position (with thigh strap)      Mobility  Bed Mobility Overal bed mobility: Needs Assistance;+2 for physical assistance Bed Mobility: Rolling;Sidelying to Sit Rolling: Min assist Sidelying to sit: +2 for safety/equipment;Min assist       General bed mobility comments: Two person assist for safety.  Pt moving very slowly, but mostly under his own power.  Verbal cues for step by step instructions of log roll technique.   Transfers Overall transfer level: Needs assistance Equipment used: Rolling walker (2 wheeled) Transfers: Sit to/from Stand Sit to Stand: +2 safety/equipment;Min assist;From elevated surface         General transfer comment: Two person min assist from elevated bed over flexed knees.  Asssit needed to stabilize RW and  support pt's trunk. Pt relying heavily on bil upper extremities to power up to standing vs. leg stength.  Once standing assist needed to lock right leg thigh componenet of brace.    Ambulation/Gait Ambulation/Gait assistance: Mod assist;+2 safety/equipment Ambulation Distance (Feet): 10 Feet Assistive device: Rolling walker (2 wheeled) Gait Pattern/deviations: Step-through pattern;Shuffle (flexed knee gait pattern, buckling bil legs) Gait velocity: decreased Gait velocity interpretation: Below normal speed for age/gender General Gait Details: Pt with flexed knee gait pattern, buckling bil, no sign of foot drag.  Very heavy reliance on arms to support his legs on RW during gait. Second person for safety and line management.         Balance Overall balance assessment: Needs assistance Sitting-balance support: Feet supported;Bilateral upper extremity supported Sitting balance-Leahy Scale: Poor Sitting balance - Comments: mod assist in sitting at times to prevent posterior LOB.  Postural control: Posterior lean Standing balance support: Bilateral upper extremity supported Standing balance-Leahy Scale: Poor                               Pertinent Vitals/Pain Pain Assessment: 0-10 Pain Score: 8  Pain Location: neck and low back radiating into buttocks Pain Descriptors / Indicators: Burning;Aching Pain Intervention(s): Limited activity within patient's tolerance;Monitored during session;Repositioned    Home Living Family/patient expects to be discharged to:: Private residence Living Arrangements: Spouse/significant other Available Help at Discharge: Available PRN/intermittently;Family Type of Home: Mobile home Home Access: Stairs to enter Entrance Stairs-Rails: Left Entrance Stairs-Number of Steps: 3 Home Layout:  One level Home Equipment: None Additional Comments: girlfriend will be (A)ing    Prior Function Level of Independence: Independent                Hand Dominance   Dominant Hand: Right    Extremity/Trunk Assessment   Upper Extremity Assessment: Defer to OT evaluation           Lower Extremity Assessment: RLE deficits/detail;LLE deficits/detail RLE Deficits / Details: bil LE functional deficits during gait.  Reported right buttock pain with transitions, better once standing.  Also buckling in bil legs with gait.   LLE Deficits / Details: bil LE functional deficits during gait.  Reported right buttock pain with transitions, better once standing.  Also buckling in bil legs with gait.    Cervical / Trunk Assessment: Other exceptions  Communication   Communication: No difficulties  Cognition Arousal/Alertness: Awake/alert Behavior During Therapy: Anxious (very tense throughout his body) Overall Cognitive Status: Within Functional Limits for tasks assessed                               Assessment/Plan    PT Assessment Patient needs continued PT services  PT Diagnosis Difficulty walking;Abnormality of gait;Generalized weakness;Acute pain   PT Problem List Decreased strength;Decreased balance;Decreased activity tolerance;Decreased mobility;Decreased knowledge of use of DME;Decreased knowledge of precautions;Decreased skin integrity  PT Treatment Interventions DME instruction;Gait training;Stair training;Functional mobility training;Therapeutic activities;Therapeutic exercise;Balance training;Neuromuscular re-education;Patient/family education;Modalities   PT Goals (Current goals can be found in the Care Plan section) Acute Rehab PT Goals Patient Stated Goal: to decrease neck and back pain PT Goal Formulation: With patient Time For Goal Achievement: 06/21/14 Potential to Achieve Goals: Good    Frequency Min 5X/week    End of Session Equipment Utilized During Treatment: Back brace Activity Tolerance: Patient limited by pain Patient left: in chair;with call bell/phone within reach Nurse Communication:  Mobility status         Time: [id number] PT Time Calculation (min) (ACUTE ONLY): 25 min   Charges:   PT Evaluation $Initial PT Evaluation Tier I: 1 Procedure PT Treatments $Therapeutic Activity: 8-22 mins        Laveda Demedeiros B. Joron Velis, PT, DPT [phone]   06/14/2014, 5:16 PM

## 2014-06-14 NOTE — Progress Notes (Signed)
A new Morphine Syringe was placed at 0734. Previous syringe was completely empty and discarded in the sharps container witnesses by Maury Dusonna RN. The PA d/c the PCA and at 0850 the PCA was removed. Syringe was wasted in pxyis.

## 2014-06-14 NOTE — Progress Notes (Signed)
Pt c/o neck stiffness and pain on going through out the day. PA aware, will incorporate neck into PT and PRN muscle relaxers should help. Will continue to monitor.

## 2014-06-14 NOTE — Evaluation (Signed)
Occupational Therapy Evaluation Patient Details Name: David Villarreal MRN: [id number] DOB: 1965-10-27 Today's Date: 06/14/2014    History of Present Illness 49 yo male s/p L5-s1 posterolateral fusion with pedicle screwes. PMH: kidney stones, appendectomy, L achilles tendon surg   Clinical Impression   Patient is s/p L 5- S1 surgery resulting in functional limitations due to the deficits listed below (see OT problem list). Pt slow progressing due to pain level. Pt very anxious with all mobility. Pt sitting eob only for this evaluation and returned to supine. Pt reports pain 10 out 10 with PCA pushes.  Patient will benefit from skilled OT acutely to increase independence and safety with ADLS to allow discharge HHOT 3n1 and Rw . Ot to reassess d/c location next visit. If patient is not progressing further than bed level may need to consider community based rehab.       Follow Up Recommendations  Home health OT    Equipment Recommendations  3 in 1 bedside comode;Other (comment) (rw)    Recommendations for Other Services       Precautions / Restrictions Precautions Precautions: Back Precaution Comments: handout provided and reviewed Required Braces or Orthoses: Spinal Brace Spinal Brace: Thoracolumbosacral orthotic;Applied in sitting position (leg strap) Restrictions Weight Bearing Restrictions: No (back precautions)      Mobility Bed Mobility Overal bed mobility: Needs Assistance Bed Mobility: Supine to Sit     Supine to sit: Max assist;HOB elevated     General bed mobility comments: needed extended time and encouragement  Transfers                      Balance Overall balance assessment: Needs assistance Sitting-balance support: Bilateral upper extremity supported;Feet supported Sitting balance-Leahy Scale: Poor                                      ADL Overall ADL's : Needs assistance/impaired Eating/Feeding: Set up;Bed level   Grooming:  Wash/dry hands;Oral care;Wash/dry face;Set up;Bed level   Upper Body Bathing: Moderate assistance;Bed level   Lower Body Bathing: Total assistance;Bed level           Toilet Transfer:  (bed level)           Functional mobility during ADLs:  (unable at this time) General ADL Comments: Pt supine on arrival with PCA complete. Pt very painful with bed mobility. pt telling staff "wait wait" in transitional movements. pt needed max encouragement. Pt reports "I am going to have to talk to Dr Yevette Edwardsumonski because this is too soon and too fast" pt reluctant to participate anticipating pain. pt with HR 135 with movement. Pt sitting eob for RN to redress wound site and return to supine due to pain and anxiety. Pt will be slow to progress     Vision     Perception     Praxis      Pertinent Vitals/Pain Pain Assessment: 0-10 Pain Score: 10-Worst pain ever Pain Location: neck and back Pain Descriptors / Indicators: Constant Pain Intervention(s): Monitored during session;Premedicated before session;PCA encouraged (pt has used entire tube of pCA and RN providing second dose)     Hand Dominance Right   Extremity/Trunk Assessment Upper Extremity Assessment Upper Extremity Assessment: Overall WFL for tasks assessed   Lower Extremity Assessment Lower Extremity Assessment: Defer to PT evaluation   Cervical / Trunk Assessment Cervical / Trunk Assessment: Other exceptions (surg) Cervical /  Trunk Exceptions: reports cramp in neck    Communication Communication Communication: No difficulties   Cognition Arousal/Alertness: Awake/alert Behavior During Therapy: Anxious;WFL for tasks assessed/performed Overall Cognitive Status: Within Functional Limits for tasks assessed                     General Comments       Exercises       Shoulder Instructions      Home Living Family/patient expects to be discharged to:: Private residence Living Arrangements: Spouse/significant  other Available Help at Discharge: Available PRN/intermittently;Family Type of Home: Mobile home Home Access: Stairs to enter Entrance Stairs-Number of Steps: 3 Entrance Stairs-Rails: Left Home Layout: One level     Bathroom Shower/Tub: Producer, television/film/video: Standard     Home Equipment: None   Additional Comments: girlfriend will be (A)ing      Prior Functioning/Environment Level of Independence: Independent             OT Diagnosis: Generalized weakness;Acute pain   OT Problem List: Decreased strength;Decreased activity tolerance;Impaired balance (sitting and/or standing);Decreased safety awareness;Decreased knowledge of use of DME or AE;Decreased knowledge of precautions;Pain   OT Treatment/Interventions: Self-care/ADL training;Therapeutic exercise;DME and/or AE instruction;Therapeutic activities;Patient/family education;Balance training    OT Goals(Current goals can be found in the care plan section) Acute Rehab OT Goals Patient Stated Goal: none stated OT Goal Formulation: With patient Time For Goal Achievement: 06/28/14 Potential to Achieve Goals: Good  OT Frequency: Min 2X/week   Barriers to D/C:            Co-evaluation              End of Session Nurse Communication: Mobility status;Precautions  Activity Tolerance: Patient limited by pain Patient left: in bed;with call bell/phone within reach;with nursing/sitter in room   Time: 0723-0748 OT Time Calculation (min): 25 min Charges:  OT General Charges $OT Visit: 1 Procedure OT Evaluation $Initial OT Evaluation Tier I: 1 Procedure OT Treatments $Self Care/Home Management : 8-22 mins G-Codes:    Boone Master B July 03, 2014, 9:49 AM Pager: [phone] r

## 2014-06-14 NOTE — Care Management Note (Signed)
CARE MANAGEMENT NOTE 06/14/2014  Patient:  David Villarreal,David Villarreal   Account Number:  [id number]  Date Initiated:  06/14/2014  Documentation initiated by:  David Villarreal,David Villarreal  Subjective/Objective Assessment:   49 yr old male admitted with right leg pain, Right side L5 radiculapathy. Patient underwent L5 Gill decompression, Right L5-S1 transforaminal interbody fusion, Left L5-S1 posterolateral fusion.     Action/Plan:   Patient will need HH OT, PT for safety eval. patient is under worker's comp. CM spoke with David Villarreal, CM [phone]. Orders faxed to her@704 [phone].   Anticipated DC Date:  06/15/2014   Anticipated DC Plan:  HOME W HOME HEALTH SERVICES      DC Planning Services  CM consult      PAC Choice  DURABLE MEDICAL EQUIPMENT  HOME HEALTH   Choice offered to / List presented to:  C-1 Patient   DME arranged  3-N-1  Levan HurstWALKER - [facility]      DME agency  [facility]     HH arranged  HH-2 PT  HH-3 OT      [facility]  OTHER - SEE NOTE   Status of service:  Completed, signed off Medicare Important Message given?   (If response is "NO", the following Medicare IM given date fields will be blank) Date Medicare IM given:   Medicare IM given by:   Date Additional Medicare IM given:   Additional Medicare IM given by:    Discharge Disposition:  HOME W HOME HEALTH SERVICES  Per UR Regulation:  Reviewed for med. necessity/level of care/duration of stay  If discussed at Long Length of Stay Meetings, dates discussed:    Comments:  06/14/14 1500 David PeperSusan Johneisha Broaden, RN BSN Case Manager Someone from worker's comp will contact patient concerning who will provide home health, it may not be arranged unitl Monday. David Villarreal states she will have rolling walker and 3in1 delivered to patient's room.

## 2014-06-15 NOTE — Progress Notes (Signed)
Occupational Therapy Treatment Patient Details Name: David Villarreal MRN: [id number] DOB: 01-25-1966 Today's Date: 06/15/2014    History of present illness 49 yo male s/p L5-s1 posterolateral fusion with pedicle screwes. PMH: kidney stones, appendectomy, L achilles tendon surg   OT comments  Focus of session on practicing sit to stand for positional change and as a precursor to ADL transfers.  Educated pt in use of AE for LB ADL and in use of 3 in 1 over toilet. Instructed in availability of tub equipment.  Pt in agreement that tub transfer bench is safest option given pt's mobility. Pt sleepy from medication, difficulty keeping eyes open.  Follow Up Recommendations  Home health OT    Equipment Recommendations  3 in 1 bedside comode;Tub/shower bench (Reacher, long handled sponge, long shoe horn, sock aide)    Recommendations for Other Services      Precautions / Restrictions Precautions Precautions: Back;Fall Precaution Comments: pt able to state no lifting and no twisting only, reeducated Required Braces or Orthoses: Spinal Brace Spinal Brace: Thoracolumbosacral orthotic;Applied in sitting position (with thigh strap) Restrictions Weight Bearing Restrictions: No       Mobility Bed Mobility         General bed mobility comments: pt up in chair, returned to chair  Transfers Overall transfer level: Needs assistance Equipment used: Rolling walker (2 wheeled) Transfers: Sit to/from Stand Sit to Stand: +2 physical assistance;Mod assist Stand pivot transfers: Min assist;+2 safety/equipment       General transfer comment: assist to rise and shift weight over feet, heavily reliant on UEs    Balance Overall balance assessment: Needs assistance Sitting-balance support: Bilateral upper extremity supported;Feet supported Sitting balance-Leahy Scale: Poor Sitting balance - Comments: supervision for sitting but cannot lift arms due to pain Postural control: Posterior lean                     ADL Overall ADL's : Needs assistance/impaired     Grooming: Wash/dry hands;Oral care;Wash/dry face;Set up;Sitting Grooming Details (indicate cue type and reason): pt unable to release walker to perform grooming in standing       Lower Body Bathing Details (indicate cue type and reason): educated pt in use of long handled bath sponge     Lower Body Dressing: Maximal assistance;+2 for physical assistance;Sit to/from stand;With adaptive equipment Lower Body Dressing Details (indicate cue type and reason): educated pt in use of reacher, sock aide, long handled shoe horn             Functional mobility during ADLs: +2 for physical assistance;Minimal assistance;Rolling walker (+2 mod to stand from chair) General ADL Comments: Pt reports having a tub/shower.  Educated pt on availability of tub transfer bench as pt is very unsteady on his feet and has yet to practice stairs. Instructed pt in use of 3 in1 over top of toilet.       Vision                     Perception     Praxis      Cognition   Behavior During Therapy: Anxious Overall Cognitive Status: Within Functional Limits for tasks assessed       Memory: Decreased recall of precautions               Extremity/Trunk Assessment               Exercises     Shoulder Instructions  General Comments      Pertinent Vitals/ Pain       Pain Assessment: Faces Pain Score: 6  Faces Pain Scale: Hurts even more Pain Location: back, incisional Pain Descriptors / Indicators: Constant;Operative site guarding;Grimacing Pain Intervention(s): Limited activity within patient's tolerance;Premedicated before session;Repositioned  Home Living                                          Prior Functioning/Environment              Frequency Min 2X/week     Progress Toward Goals  OT Goals(current goals can now be found in the care plan section)  Progress towards OT  goals: Progressing toward goals  Acute Rehab OT Goals Patient Stated Goal: to decrease neck and back pain  Plan Discharge plan remains appropriate    Co-evaluation                 End of Session Equipment Utilized During Treatment: Rolling walker;Gait belt   Activity Tolerance Patient limited by pain   Patient Left in chair;with call bell/phone within reach   Nurse Communication          Time: 1121-1140 OT Time Calculation (min): 19 min  Charges: OT General Charges $OT Visit: 1 Procedure OT Treatments $Self Care/Home Management : 8-22 mins  Evern BioMayberry, Densel Kronick Lynn 06/15/2014, 11:55 AM  [phone]

## 2014-06-15 NOTE — Discharge Instructions (Signed)
See handout in chart

## 2014-06-15 NOTE — Progress Notes (Signed)
    Patient sleeping soundly this am, snoring.  Fell back to sleep as I was speaking with him.  No c/o, +tol PO and + flatus SCDs not applied at this moment.  Progress with PT slow yesterday.  Physical Exam: BP 139/86 mmHg  Pulse 113  Temp(Src) 99.8 F (37.7 C) (Oral)  Resp 20  Ht 5' 8.5" (1.74 m)  Wt 87.998 kg (194 lb)  BMI 29.07 kg/m2  SpO2 90%   Dressing in place. Pt in bed, SCD's presently off.  Calves soft, not swollen. Abdomen soft. NVI  POD #2 s/p L5-S1 decompression and fusion  - Resolved pre-op R leg pain - Expected PO LBP better controlled than yesterday. - up with PT/OT, encourage ambulation  -Brace at all times when OOB except PM trips to bathroom  - Percocet for pain, Valium for muscle spasms  -Will add Oxycontin 20mg  q12   -Will add robaxin PRN between valium doses for muscle px/tightness - likely d/c home today vs Sunday pending px and PT/OT

## 2014-06-15 NOTE — Progress Notes (Signed)
Physical Therapy Treatment Patient Details Name: Oval Cavazos MRN: [id number] DOB: 25-Feb-1966 Today's Date: 06/15/2014    History of Present Illness 49 yo male s/p L5-s1 posterolateral fusion with pedicle screwes. PMH: kidney stones, appendectomy, L achilles tendon surg    PT Comments    Pt progressing slowly, ambulated 20' today with min A and chair brought behind, but required +2 mod A to stand from recliner. Pt needs to be able to ambulate further as well as practice stairs before d/c home. Recommend continued stay for progressing mobility.    Follow Up Recommendations  Home health PT;Supervision/Assistance - 24 hour     Equipment Recommendations  Rolling walker with 5" wheels    Recommendations for Other Services       Precautions / Restrictions Precautions Precautions: Back Precaution Comments: reviewed precautions Required Braces or Orthoses: Spinal Brace Spinal Brace: Thoracolumbosacral orthotic;Applied in sitting position (with thigh strap) Restrictions Weight Bearing Restrictions: No    Mobility  Bed Mobility Overal bed mobility: Needs Assistance Bed Mobility: Rolling;Sidelying to Sit Rolling: Supervision Sidelying to sit: Supervision       General bed mobility comments: pt able to get to EOB keeping precautions with vc's and heavy use of rail and increased time  Transfers Overall transfer level: Needs assistance Equipment used: Rolling walker (2 wheeled) Transfers: Sit to/from [employer] Transfers Sit to Stand: +2 physical assistance;Min assist;Mod assist Stand pivot transfers: Min assist;+2 safety/equipment       General transfer comment: from elevated bed, pt able to stand with min A with +2 assist stabilizing RW because pt had to put one hand on it. From recliner, pr required +2 mod A for power up. Continues to have difficulty extending knees and using legs to stand, uses increased force from UE's.   Ambulation/Gait Ambulation/Gait assistance:  Min assist;+2 safety/equipment Ambulation Distance (Feet): 20 Feet Assistive device: Rolling walker (2 wheeled) Gait Pattern/deviations: Shuffle;Step-through pattern;Decreased stride length Gait velocity: decreased Gait velocity interpretation: <1.8 ft/sec, indicative of risk for recurrent falls General Gait Details: pt able to extend knees for first few feet but more and more flexed position as he kept going. Min A for stability as well as +2 for bringing chair behind. vc's for pushing shoulder down from ears as pt has been c/o neck pain but lots of tension in neck and upper back with mobility due to LE weakness and pain   Stairs            Wheelchair Mobility    Modified Rankin (Stroke Patients Only)       Balance Overall balance assessment: Needs assistance Sitting-balance support: Bilateral upper extremity supported;Feet supported Sitting balance-Leahy Scale: Poor Sitting balance - Comments: supervision for sitting but cannot lift arms due to pain Postural control: Posterior lean Standing balance support: No upper extremity supported;During functional activity Standing balance-Leahy Scale: Fair Standing balance comment: pt able to hold front of brace while back of brace donned. Maintained standing x5 mins for donning of brace and wife assisted with this, learning how to use brace correctly                    Cognition Arousal/Alertness: Awake/alert Behavior During Therapy: Anxious Overall Cognitive Status: Within Functional Limits for tasks assessed                      Exercises      General Comments        Pertinent Vitals/Pain Pain Assessment: 0-10 Pain Score: 6  Pain Location: incision and buttocks Pain Intervention(s): Monitored during session;Premedicated before session    Home Living                      Prior Function            PT Goals (current goals can now be found in the care plan section) Acute Rehab PT  Goals Patient Stated Goal: to decrease neck and back pain PT Goal Formulation: With patient Time For Goal Achievement: 06/21/14 Potential to Achieve Goals: Good Progress towards PT goals: Progressing toward goals    Frequency  Min 5X/week    PT Plan Current plan remains appropriate    Co-evaluation             End of Session Equipment Utilized During Treatment: Back brace Activity Tolerance: Patient limited by pain Patient left: in chair;with call bell/phone within reach;with family/visitor present     Time: 0981-19140929-1004 PT Time Calculation (min) (ACUTE ONLY): 35 min  Charges:  $Gait Training: 8-22 mins $Therapeutic Activity: 8-22 mins                    G Codes:     Lyanne CoVictoria Mecca Barga, PT  Acute Rehab Services  [phone]  Lyanne CoManess, Cleola Perryman 06/15/2014, 11:18 AM

## 2014-06-16 MED ORDER — OXYCODONE HCL ER 10 MG PO T12A
10.0000 mg | EXTENDED_RELEASE_TABLET | Freq: Two times a day (BID) | ORAL | Status: DC
  Administered 2014-06-16 – 2014-06-17 (×2): 10 mg via ORAL
  Filled 2014-06-16 (×2): qty 1

## 2014-06-16 NOTE — Progress Notes (Addendum)
    Patient up in chair, reports still LBP in certain area despite present pain med regimen, although his attention span is short and he drifts asleep when conversing.  +tol PO and + flatus, -BM SCDs not applied at this moment.  PT reports yesterday not yet cleared for home with HHPT  Physical Exam: BP 137/78 mmHg  Pulse 99  Temp(Src) 99 F (37.2 C) (Oral)  Resp 16  Ht 5' 8.5" (1.74 m)  Wt 87.998 kg (194 lb)  BMI 29.07 kg/m2  SpO2 93%   Dressing in place. Pt in chair, SCD's presently off.  Calves soft, not swollen. Abdomen soft. NVI  POD #3 s/p L5-S1 decompression and fusion  - Resolved pre-op R leg pain - still c/o LBP. - up with PT/OT, encourage ambulation  -Brace at all times when OOB except PM trips to bathroom  - Percocet for pain, Valium for muscle spasms  -will adjust oxycontin down to 10 mg BID due to patient's noted somnolence today with me and yesterday with OT  -Will add robaxin PRN between valium doses for muscle px/tightness - d/c when pain OK on just oral meds and clears PT for d/c home with HHPT

## 2014-06-16 NOTE — Progress Notes (Signed)
Physical Therapy Treatment Patient Details Name: David Villarreal MRN: [id number] DOB: [birth year] Today's Date: 06/16/2014    History of Present Illness 49 yo male s/p L5-s1 posterolateral fusion with pedicle screwes. PMH: kidney stones, appendectomy, L achilles tendon surg    PT Comments    Pt much improved today with better pain control, increased functional strength and increased independence with mobility ADLs.  Approaching ready for d/c home with HHPT to follow.    Follow Up Recommendations        Equipment Recommendations       Recommendations for Other Services       Precautions / Restrictions Precautions Precautions: Back;Fall Required Braces or Orthoses: Spinal Brace Spinal Brace: Thoracolumbosacral orthotic;Applied in sitting position    Mobility  Bed Mobility Overal bed mobility: Needs Assistance Bed Mobility: Sit to Sidelying         Sit to sidelying: Min assist General bed mobility comments: up in chair on arrival, returned to bed; observed caregiver assist with legs to bed surface, pt able to maintain spinal alignment and assume confortable position in sidelying  Transfers Overall transfer level: Needs assistance Equipment used: Rolling walker (2 wheeled) Transfers: Sit to/from Stand Sit to Stand: Min assist         General transfer comment: improved ability to transitiong to/from sitting with caregiver bracing RW and pt power up with hands and legs.  Ambulation/Gait Ambulation/Gait assistance: Min guard Ambulation Distance (Feet): 200 Feet (2x100 with stairs between) Assistive device: Rolling walker (2 wheeled) Gait Pattern/deviations: Step-through pattern;Decreased stride length;Narrow base of support;Ataxic Gait velocity: decreased   General Gait Details: improved transfer of weight from hands to legs and improved knee strength.  one time LOB posterior when stopped to stand and talk with assist to correct, otherwise no issues.  Cues for stride  length, for relax shoulders and transfer UE load to large muscles of back (will need reiforcement).  Instructed to walk multiple short bouts versus longer distances   Stairs Stairs: Yes Stairs assistance: Min guard Stair Management: Two rails;Step to pattern;Forwards Number of Stairs: 3 General stair comments: 2x 3 steps, 1x 2 steps for low/high step height; instruction for caregiver assist, for up with good/down with bad; for use of rail and wall to simulate home.  Pt/caregiver confident  Wheelchair Mobility    Modified Rankin (Stroke Patients Only)       Balance Overall balance assessment: Needs assistance Sitting-balance support: Bilateral upper extremity supported;Feet supported Sitting balance-Leahy Scale: Fair     Standing balance support: Bilateral upper extremity supported;During functional activity Standing balance-Leahy Scale: Fair                      Cognition Arousal/Alertness: Awake/alert Behavior During Therapy: WFL for tasks assessed/performed Overall Cognitive Status: Within Functional Limits for tasks assessed                      Exercises      General Comments General comments (skin integrity, edema, etc.): with caregiver able to don/doff TLSO independent      Pertinent Vitals/Pain Pain Assessment: Faces Faces Pain Scale: Hurts little more Pain Intervention(s): Limited activity within patient's tolerance;Monitored during session;Repositioned;Premedicated before session    Home Living                      Prior Function            PT Goals (current goals can now be found in the  care plan section) Progress towards PT goals: Progressing toward goals    Frequency       PT Plan Current plan remains appropriate    Co-evaluation             End of Session Equipment Utilized During Treatment: Back brace Activity Tolerance: No increased pain Patient left: in bed;with call bell/phone within reach;with  family/visitor present     Time: 1610-9604 PT Time Calculation (min) (ACUTE ONLY): 30 min  Charges:  $Gait Training: 23-37 mins                    G Codes:      Dennis Bast 06/16/2014, 11:16 AM

## 2014-06-17 NOTE — Progress Notes (Signed)
Occupational Therapy Treatment Patient Details Name: Cane Dubray MRN: [id number] DOB: 1965-11-20 Today's Date: 06/17/2014    History of present illness 49 yo male s/p L5-s1 posterolateral fusion with pedicle screwes. PMH: kidney stones, appendectomy, L achilles tendon surg   OT comments  Pt making good progress. Will need 3 in 1 and tub bench, in addition to "hip kit"/AE for safe D/C home. Recommend pt continue with HHOT to maximize functional level of independence. Pt ready to D/C home when medically stable.  Follow Up Recommendations  Home health OT    Equipment Recommendations  3 in 1 bedside comode;Tub/shower bench    Recommendations for Other Services      Precautions / Restrictions Precautions Precautions: Back;Fall Precaution Comments: Pt independent with back precautions. Required Braces or Orthoses: Spinal Brace Spinal Brace: Thoracolumbosacral orthotic;Applied in sitting position Restrictions Weight Bearing Restrictions: No       Mobility Bed Mobility Overal bed mobility: Modified Independent Bed Mobility: Sidelying to Sit Rolling: Supervision Sidelying to sit: Supervision       General bed mobility comments: Pt up in chair  Transfers Overall transfer level: Needs assistance Equipment used: Rolling walker (2 wheeled)   Sit to Stand: Min assist         General transfer comment: Pt prefers to push up using RW. Educated on safe technique                                       ADL                                       Functional mobility during ADLs: Supervision/safety General ADL Comments: Reviewed back precautions with pt/wife regading ADL. Educated on use of tongs for toileting. Pt asked about hip kit for AE. Pt will benefit from AE for LB ADL as his wife is returning to work. Pt wil need to purchase and be reimbursed if appropriate. discussed use of 3 in 1 for toilet and tub bench for shower. Pt/wife independetly  donned/doffed TLSO. Educated on propping against wall to readjust if wife was not present.                                       Cognition   Behavior During Therapy: WFL for tasks assessed/performed Overall Cognitive Status: Within Functional Limits for tasks assessed                                                 General Comments  Pt asking about sleeping in a recliner. Stressed importance of sleeping in bed on back or side and to limit pressure on back that is caused from sleeping in recliners. Pt asking about sleeping on his stomach - deferred this to his MD.    Pertinent Vitals/ Pain       Pain Assessment: 0-10 Pain Score: 5  Pain Location: back Pain Descriptors / Indicators: Aching Pain Intervention(s): Limited activity within patient's tolerance;Monitored during session  Frequency Min 2X/week     Progress Toward Goals  OT Goals(current goals can now be found in the care plan section)  Progress towards OT goals: Progressing toward goals (will continue with HHOT)  Acute Rehab OT Goals Patient Stated Goal: to decrease neck and back pain OT Goal Formulation: With patient Time For Goal Achievement: 06/28/14 Potential to Achieve Goals: Good ADL Goals Pt Will Perform Grooming: with min assist;sitting Pt Will Perform Upper Body Bathing: with min assist;sitting;with adaptive equipment Pt Will Perform Lower Body Bathing: with mod assist;with adaptive equipment;sit to/from stand Pt Will Transfer to Toilet: with min assist;bedside commode;ambulating Pt Will Perform Tub/Shower Transfer: Shower transfer;with min assist;ambulating;3 in 1;rolling walker Additional ADL Goal #1: Pt will complete bed mobility min (A) with hOB < 20 degrees no rails  Plan Discharge plan remains appropriate    Co-evaluation                 End of Session     Activity Tolerance  Patient tolerated treatment well   Patient Left in chair;with call bell/phone within reach;with family/visitor present   Nurse Communication Mobility status        Time: 1155-1208 OT Time Calculation (min): 13 min  Charges: OT General Charges $OT Visit: 1 Procedure OT Treatments $Self Care/Home Management : 8-22 mins  WARD,HILLARY 06/17/2014, 1:39 PM   Hilary Ward, OTR/L  [phone] 06/17/2014 

## 2014-06-17 NOTE — Progress Notes (Signed)
Physical Therapy Treatment Patient Details Name: David Villarreal MRN: [id number] DOB: [birth year] Today's Date: 06/17/2014    History of Present Illness 49 yo male s/p L5-s1 posterolateral fusion with pedicle screwes. PMH: kidney stones, appendectomy, L achilles tendon surg    PT Comments    Pt progressing toward goals and increasing ambulation distance. Pt safe to D/C from a mobility standpoint based on progression towards goals set on PT eval.  Follow Up Recommendations  Home health PT;Supervision/Assistance - 24 hour     Equipment Recommendations  Rolling walker with 5" wheels    Recommendations for Other Services       Precautions / Restrictions Precautions Precautions: Back;Fall Precaution Comments: Pt independent with back precautions. Required Braces or Orthoses: Spinal Brace Spinal Brace: Thoracolumbosacral orthotic;Applied in sitting position Restrictions Weight Bearing Restrictions: No    Mobility  Bed Mobility Overal bed mobility: Modified Independent Bed Mobility: Sidelying to Sit Rolling: Supervision Sidelying to sit: Supervision       General bed mobility comments: Pt able to maintain spinal alignment and push up to upright position.  Transfers Overall transfer level: Needs assistance Equipment used: Rolling walker (2 wheeled)   Sit to Stand: Min assist         General transfer comment: Pt able to power up with hands and legs with wife bracing RW.  Ambulation/Gait Ambulation/Gait assistance: Min guard Ambulation Distance (Feet): 500 Feet Assistive device: Rolling walker (2 wheeled) Gait Pattern/deviations: Step-through pattern;Decreased stride length;Narrow base of support;Ataxic   Gait velocity interpretation: Below normal speed for age/gender General Gait Details: Cues to relax shoulders and transfer UE WB to LE.   Stairs   Stairs assistance: Min guard Stair Management: Two rails;Step to pattern;Forwards Number of Stairs: 5 General stair  comments: Cues for sequencing (up with good/down with bad).  Wheelchair Mobility    Modified Rankin (Stroke Patients Only)       Balance                                    Cognition Arousal/Alertness: Awake/alert Behavior During Therapy: WFL for tasks assessed/performed Overall Cognitive Status: Within Functional Limits for tasks assessed                      Exercises      General Comments        Pertinent Vitals/Pain Pain Assessment: 0-10 Pain Score: 5  Pain Location: back Pain Descriptors / Indicators: Constant;Grimacing;Operative site guarding Pain Intervention(s): Monitored during session;Repositioned    Home Living                      Prior Function            PT Goals (current goals can now be found in the care plan section) Progress towards PT goals: Progressing toward goals    Frequency  Min 5X/week    PT Plan Current plan remains appropriate    Co-evaluation             End of Session Equipment Utilized During Treatment: Back brace Activity Tolerance: Patient tolerated treatment well;No increased pain Patient left: in chair;with call bell/phone within reach;with family/visitor present     Time: [id number] PT Time Calculation (min) (ACUTE ONLY): 26 min  Charges:  $Gait Training: 8-22 mins $Therapeutic Activity: 8-22 mins  G CodesLeonard Schwartz, SPTA 06/17/2014, 9:59 AM

## 2014-06-17 NOTE — Progress Notes (Signed)
   Patient up in chair, just finished PT, doing well good progress tolerating brace OK+tol PO and + flatus, -BM   Physical Exam: BP 155/83 mmHg  Pulse 113  Temp(Src) 98.7 F (37.1 C) (Axillary)  Resp 16  Ht 5' 8.5" (1.74 m)  Wt 87.998 kg (194 lb)  BMI 29.07 kg/m2  SpO2 96%   Dressing in place. Pt in chair, SCD's presently off. Calves soft, not swollen. Abdomen soft. NVI  POD #4 s/p L5-S1 decompression and fusion  - Resolved pre-op R leg pain - expected PO LBP. - up with PT/OT, encourage ambulation -Brace at all times when OOB except PM trips to bathroom  - Percocet for pain, Valium for muscle spasms -Will add robaxin PRN between valium doses for muscle px/tightness [facility]

## 2014-07-03 NOTE — Discharge Summary (Signed)
Patient ID: David Villarreal MRN: [id number] DOB/AGE: September 19, 1965 49 y.o.  Admit date: 06/13/2014 Discharge date: 06/17/14  Admission Diagnoses:  Active Problems:   Radiculopathy   Discharge Diagnoses:  Same  Past Medical History  Diagnosis Date  . Allergy   . Stone in kidney   . Asthma     PMH; as a child only    Surgeries: Procedure(s): POSTERIOR LUMBAR FUSION 1 LEVEL L5-S1 on 06/13/2014   Consultants:  None  Discharged Condition: Improved  Hospital Course: David ScrewsMarco Villarreal is an 49 y.o. male who was admitted 06/13/2014 for operative treatment of radiculopathy. Patient has severe unremitting pain that affects sleep, daily activities, and work/hobbies. After pre-op clearance the patient was taken to the operating room on 06/13/2014 and underwent  Procedure(s): POSTERIOR LUMBAR FUSION 1 LEVEL L5-S1.    Patient was given perioperative antibiotics:  Anti-infectives    Start     Dose/Rate Route Frequency Ordered Stop   06/14/14 0200  ceFAZolin (ANCEF) IVPB 1 g/50 mL premix     1 g 100 mL/hr over 30 Minutes Intravenous Every 8 hours 06/13/14 2105 06/14/14 0908   06/13/14 0953  ceFAZolin (ANCEF) 2-3 GM-% IVPB SOLR    Comments:  Scronce, Trina   : cabinet override      06/13/14 0953 06/13/14 2159   06/13/14 0600  ceFAZolin (ANCEF) IVPB 2 g/50 mL premix     2 g 100 mL/hr over 30 Minutes Intravenous On call to O.R. 06/12/14 1314 06/13/14 1800       Patient was given sequential compression devices, early ambulation to prevent DVT.  Patient benefited maximally from hospital stay and there were no complications.    Recent vital signs: BP 155/83 mmHg  Pulse 113  Temp(Src) 98.7 F (37.1 C) (Axillary)  Resp 16  Ht 5' 8.5" (1.74 m)  Wt 87.998 kg (194 lb)  BMI 29.07 kg/m2  SpO2 96%  Discharge Medications:     Medication List    TAKE these medications        LYRICA 50 MG capsule  Generic drug:  pregabalin  Take 1 capsule by mouth 2 (two) times daily.     terbinafine 250 MG  tablet  Commonly known as:  LAMISIL  Take 1 tablet (250 mg total) by mouth daily.        Diagnostic Studies: Dg Chest 2 View  06/13/2014   CLINICAL DATA:  Preop for posterior lumbar fusion  EXAM: CHEST  2 VIEW  COMPARISON:  None.  FINDINGS: Cardiomediastinal silhouette is unremarkable. No acute infiltrate or pleural effusion. No pulmonary edema. Bony thorax is unremarkable.  IMPRESSION: No active cardiopulmonary disease.   Electronically Signed   By: Natasha MeadLiviu  Pop M.D.   On: 06/13/2014 10:31   Dg Lumbar Spine 2-3 Views  06/13/2014   CLINICAL DATA:  Lumbar spine fusion.  EXAM: DG C-ARM GT 120 MIN; LUMBAR SPINE - 2-3 VIEW  COMPARISON:  06/13/2014  FINDINGS: A series of 4 intraoperative spot radiographs show placement of ray cages and pedicle Villarreal with posterior spinal fixation rods at level of L5-S1. Residual grade 1 anterolisthesis noted at this level, as seen previously.  IMPRESSION: Status post fusion at L5-S1, with residual grade 1 anterolisthesis at this level.   Electronically Signed   By: Myles RosenthalJohn  Stahl M.D.   On: 06/13/2014 18:18   Dg Lumbar Spine 1 View  06/13/2014   CLINICAL DATA:  Interbody fusion  EXAM: LUMBAR SPINE - 1 VIEW  COMPARISON:  None.  FINDINGS: Cross-table  lateral lumbar spine image obtained. Metallic probe tips are posterior to the inferior aspect of the L4 vertebral body and posterior to the S1-2 level. There is 13 mm of anterolisthesis of L5 on S1 with pars defects at L5 bilaterally. No other spondylolisthesis. There is moderate disc space narrowing at L5-S1.  IMPRESSION: Metallic probe tips as described. 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   Electronically Signed   By: Bretta Bang III M.D.   On: 06/13/2014 15:22   Dg C-arm Gt 120 Min  06/13/2014   CLINICAL DATA:  Lumbar spine fusion.  EXAM: DG C-ARM GT 120 MIN; LUMBAR SPINE - 2-3 VIEW  COMPARISON:  06/13/2014  FINDINGS: A series of 4 intraoperative spot radiographs show placement of ray cages and  pedicle Villarreal with posterior spinal fixation rods at level of L5-S1. Residual grade 1 anterolisthesis noted at this level, as seen previously.  IMPRESSION: Status post fusion at L5-S1, with residual grade 1 anterolisthesis at this level.   Electronically Signed   By: Myles Rosenthal M.D.   On: 06/13/2014 18:18    Disposition: 01-Home or Self Care   POD #4 s/p L5-S1 decompression and fusion  - Resolved pre-op R leg pain - expected PO LBP. - up with PT/OT, encourage ambulation -Brace at all times when OOB except PM trips to bathroom  - Percocet for pain, Valium for muscle spasms -Will add robaxin PRN between valium doses for muscle px/tightness - d/c today with [facility] -Written scripts for pain signed and in chart -D/C instructions sheet printed and in chart -D/C today  -F/U in office 2 weeks   Signed: Georga Bora 07/03/2014, 7:24 PM

## 2015-05-15 ENCOUNTER — Other Ambulatory Visit: Payer: Self-pay | Admitting: Orthopedic Surgery

## 2015-05-15 DIAGNOSIS — M545 Low back pain, unspecified: Secondary | ICD-10-CM

## 2015-05-15 DIAGNOSIS — G8929 Other chronic pain: Secondary | ICD-10-CM

## 2015-05-26 ENCOUNTER — Ambulatory Visit
Admission: RE | Admit: 2015-05-26 | Discharge: 2015-05-26 | Disposition: A | Discharge status: Home / Self Care | Payer: Worker's Compensation | Source: Ambulatory Visit | Attending: Orthopedic Surgery | Admitting: Orthopedic Surgery

## 2015-05-26 DIAGNOSIS — M545 Low back pain: Principal | ICD-10-CM

## 2015-05-26 DIAGNOSIS — G8929 Other chronic pain: Secondary | ICD-10-CM

## 2017-06-21 ENCOUNTER — Other Ambulatory Visit: Payer: Self-pay | Admitting: Orthopedic Surgery

## 2017-06-21 DIAGNOSIS — M533 Sacrococcygeal disorders, not elsewhere classified: Secondary | ICD-10-CM

## 2017-07-01 ENCOUNTER — Ambulatory Visit
Admission: RE | Admit: 2017-07-01 | Discharge: 2017-07-01 | Disposition: A | Discharge status: Home / Self Care | Payer: BLUE CROSS/BLUE SHIELD | Source: Ambulatory Visit | Attending: Orthopedic Surgery | Admitting: Orthopedic Surgery

## 2017-07-01 ENCOUNTER — Ambulatory Visit
Admission: RE | Admit: 2017-07-01 | Discharge: 2017-07-01 | Disposition: A | Discharge status: Home / Self Care | Payer: Self-pay | Source: Ambulatory Visit | Attending: Orthopedic Surgery | Admitting: Orthopedic Surgery

## 2017-07-01 DIAGNOSIS — M533 Sacrococcygeal disorders, not elsewhere classified: Secondary | ICD-10-CM

## 2017-10-25 ENCOUNTER — Other Ambulatory Visit: Payer: Self-pay | Admitting: Orthopedic Surgery

## 2017-10-25 DIAGNOSIS — M533 Sacrococcygeal disorders, not elsewhere classified: Secondary | ICD-10-CM

## 2017-11-08 ENCOUNTER — Ambulatory Visit
Admission: RE | Admit: 2017-11-08 | Discharge: 2017-11-08 | Disposition: A | Discharge status: Home / Self Care | Payer: Self-pay | Source: Ambulatory Visit | Attending: Orthopedic Surgery | Admitting: Orthopedic Surgery

## 2017-11-08 ENCOUNTER — Ambulatory Visit
Admission: RE | Admit: 2017-11-08 | Discharge: 2017-11-08 | Disposition: A | Discharge status: Home / Self Care | Payer: PRIVATE HEALTH INSURANCE | Source: Ambulatory Visit | Attending: Orthopedic Surgery | Admitting: Orthopedic Surgery

## 2017-11-08 DIAGNOSIS — M533 Sacrococcygeal disorders, not elsewhere classified: Secondary | ICD-10-CM

## 2019-08-15 IMAGING — CT CT BIOPSY
1 of 4 series · 14 of 32 positions shown, 19 images · non-contrast
Comparison: none

CLINICAL DATA: Right posterior pelvic pain.

[Series 2: needle -guided injection · axial · 0.74mm/px · z∈[-440,-328]mm · 14 of 66 slices shown, 19 images]
[im 5/66  soft-tissue]
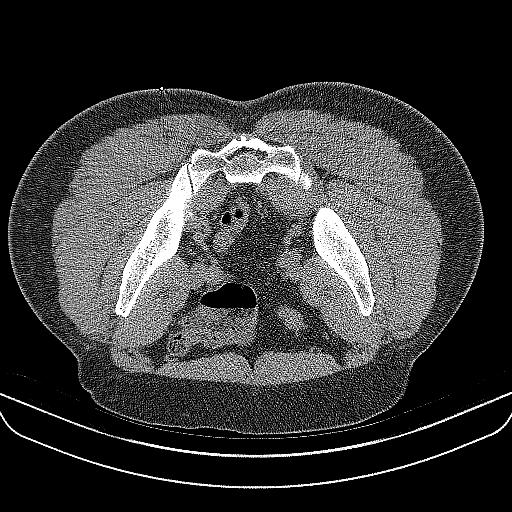
[im 5/66  bone]
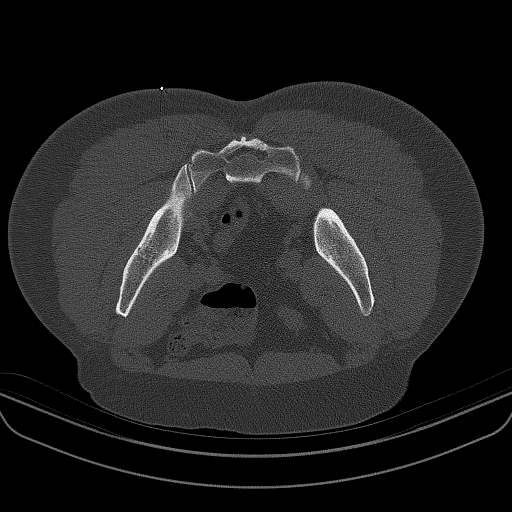
[im 9/66  soft-tissue]
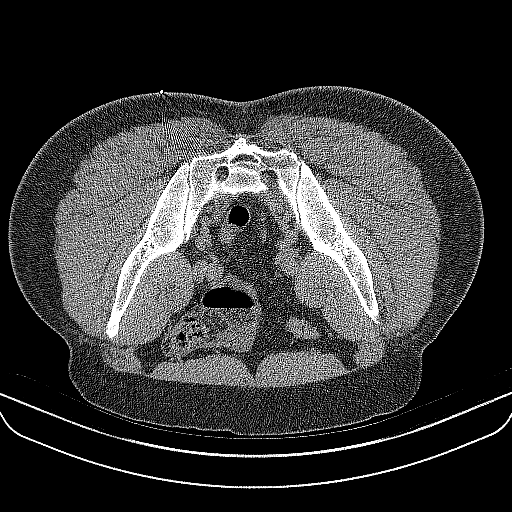
[im 13/66  soft-tissue]
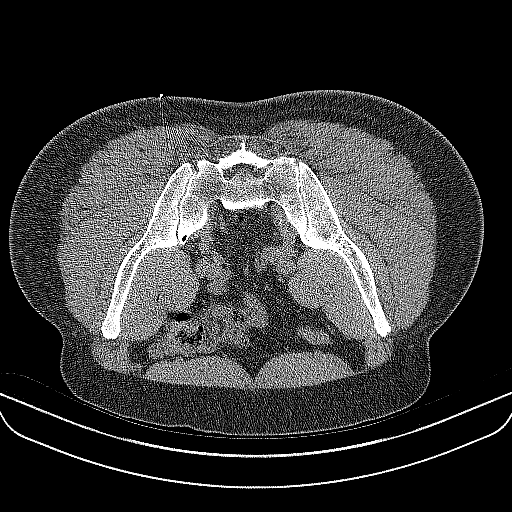
[im 21/66  soft-tissue]
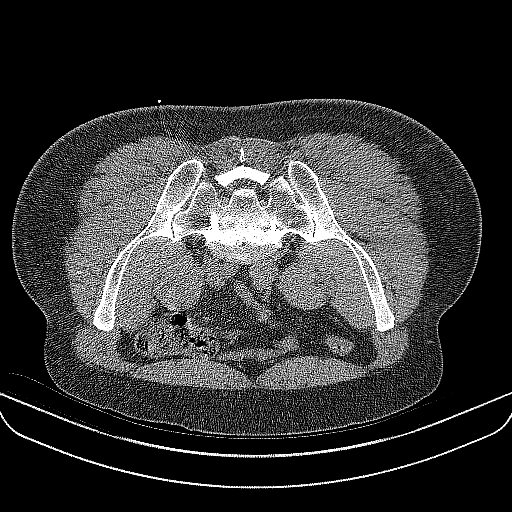
[im 25/66  soft-tissue]
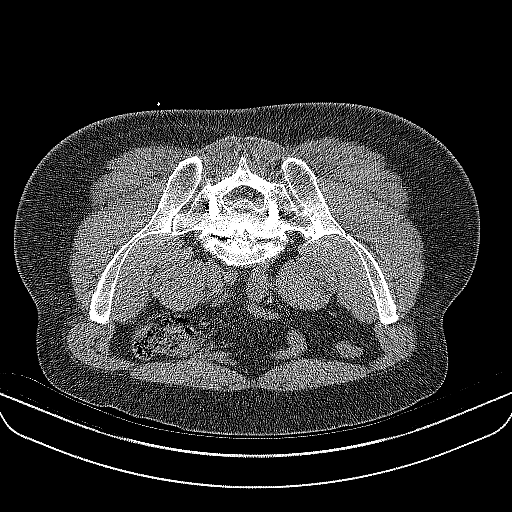
[im 29/66  soft-tissue]
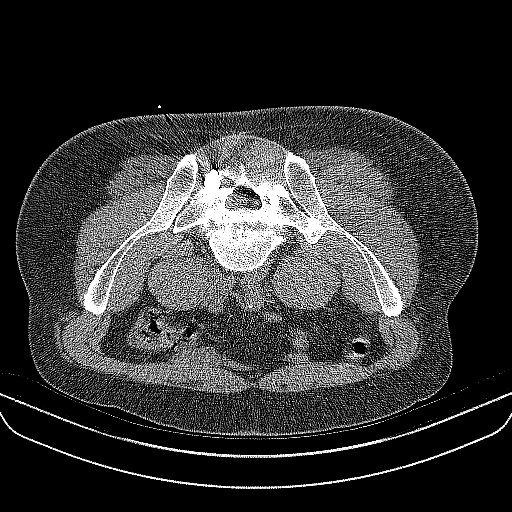
[im 33/66  soft-tissue]
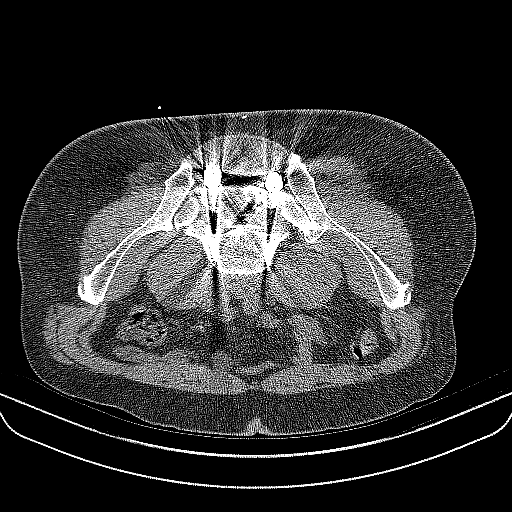
[im 37/66  soft-tissue]
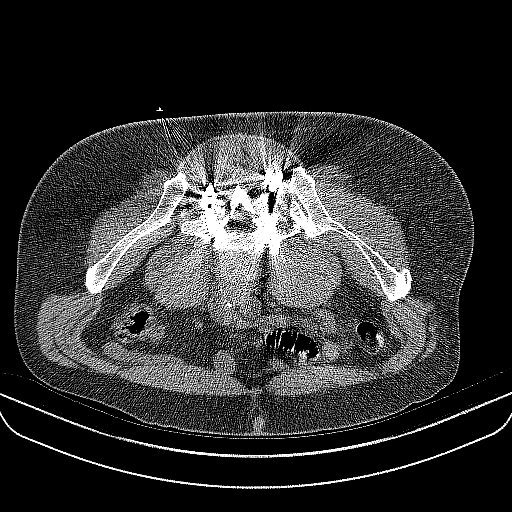
[im 41/66  soft-tissue]
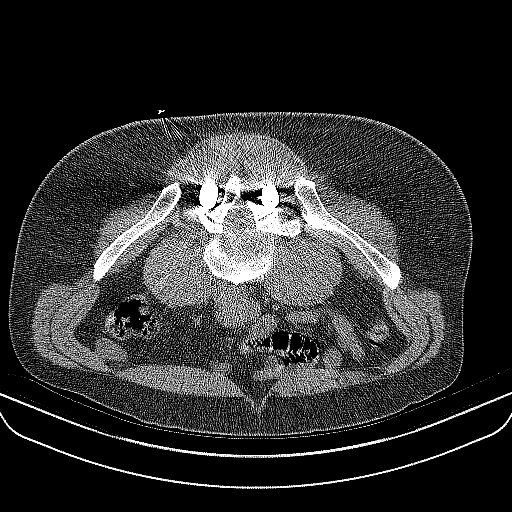
[im 41/66  bone]
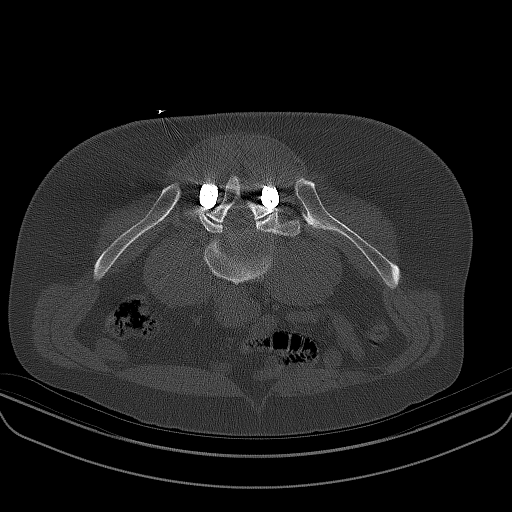
[im 45/66  soft-tissue]
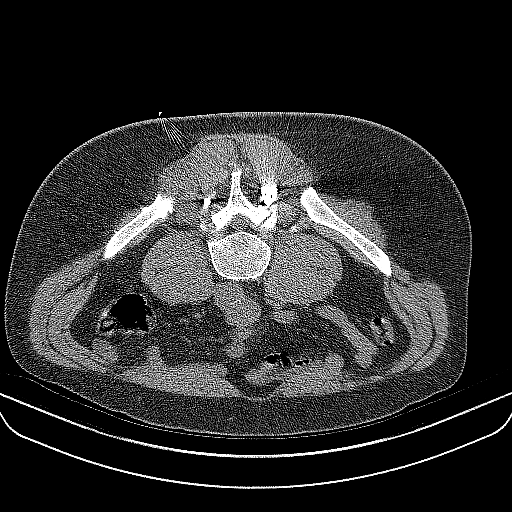
[im 49/66  lung]
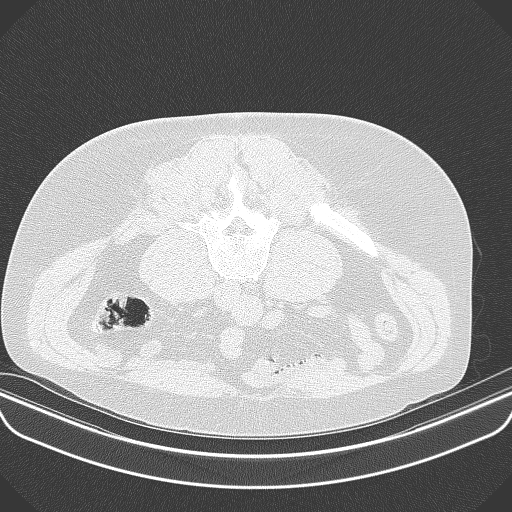
[im 53/66  soft-tissue]
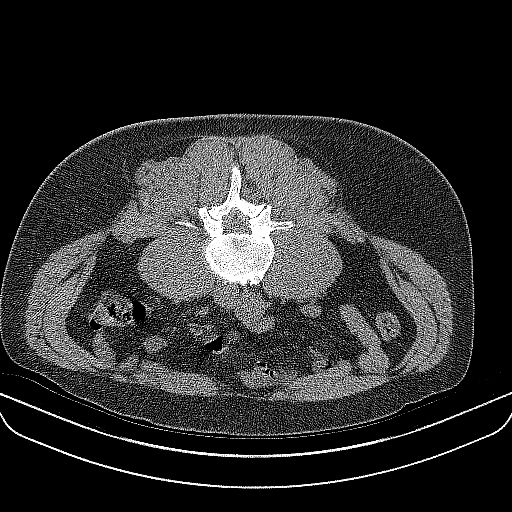
[im 53/66  lung]
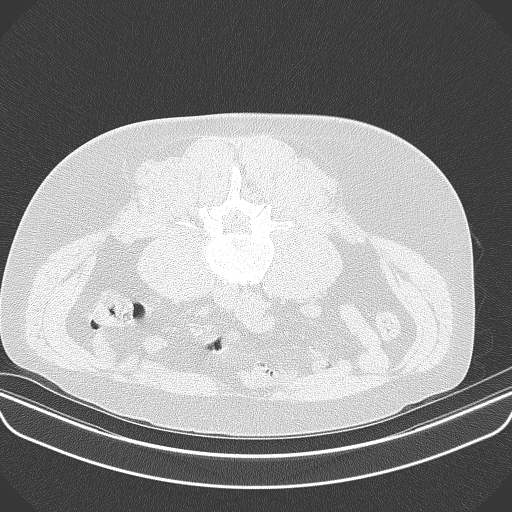
[im 57/66  soft-tissue]
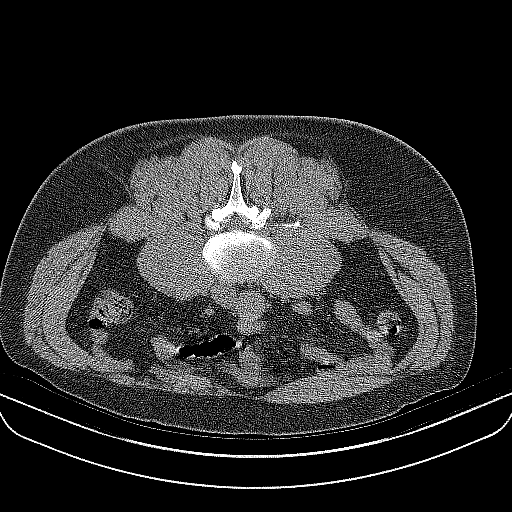
[im 57/66  lung]
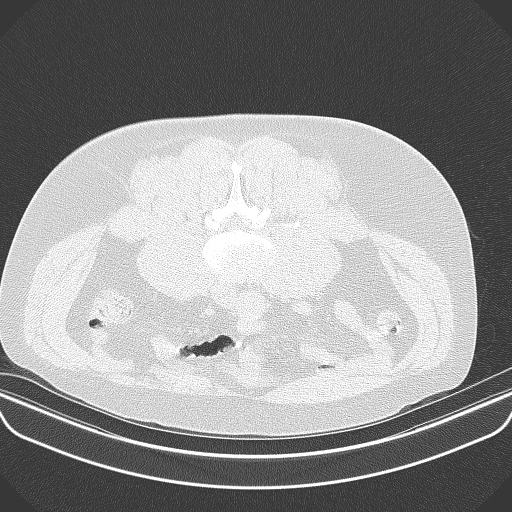
[im 61/66  soft-tissue]
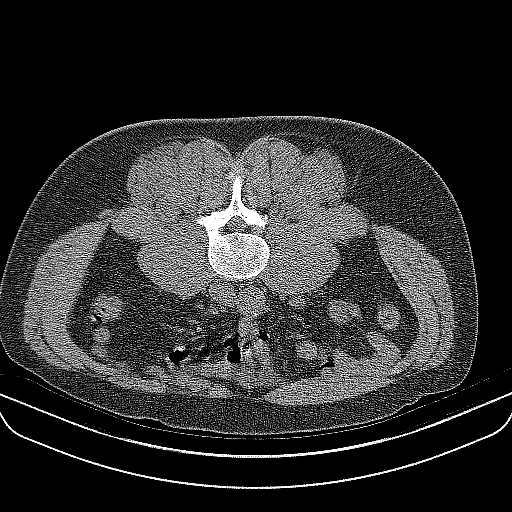
[im 61/66  lung]
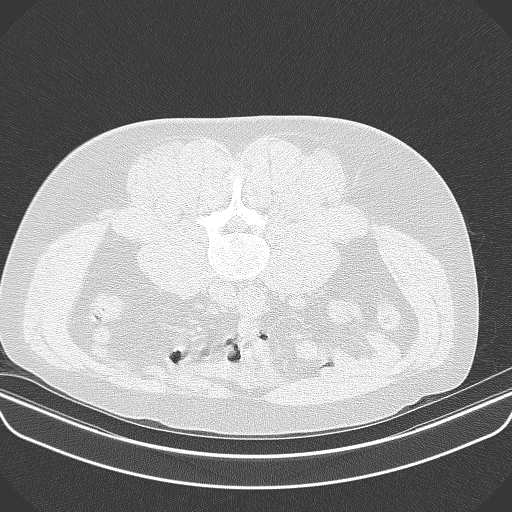

[14 of 32 positions shown; findings below may reference images not displayed]

EXAM:
Right CT GUIDED SI JOINT INJECTION



After local anesthesia with 1% lidocaine without epinephrine and
subsequent deep anesthesia, a 22 gauge spinal needle was advanced
into the right SI joint under intermittent CT guidance.

Once the needle was in satisfactory position, representative image
was captured with the needle demonstrated in the sacroiliac joint.
Subsequently, 2.0 mL 1% lidocaine was injected into the right SI
joint. Needles removed and a sterile dressing applied.

No complications were observed.

Immediately after the injection, the patient reported a decrease in
his pain.
IMPRESSION: Successful CT-guided right SI joint injection.

Patient reported immediate positive response after the injection.

## 2021-10-28 DIAGNOSIS — S46011A Strain of muscle(s) and tendon(s) of the rotator cuff of right shoulder, initial encounter: Secondary | ICD-10-CM | POA: Diagnosis not present

## 2022-02-05 ENCOUNTER — Other Ambulatory Visit: Payer: Self-pay | Admitting: Orthopedic Surgery

## 2022-02-25 ENCOUNTER — Inpatient Hospital Stay (HOSPITAL_COMMUNITY): Admission: RE | Admit: 2022-02-25 | Payer: Self-pay | Source: Ambulatory Visit

## 2022-03-10 ENCOUNTER — Inpatient Hospital Stay: Admit: 2022-03-10 | Payer: Self-pay | Admitting: Orthopedic Surgery

## 2022-03-10 SURGERY — TRANSFORAMINAL LUMBAR INTERBODY FUSION (TLIF) WITH PEDICLE SCREW FIXATION 1 LEVEL
Anesthesia: General | Laterality: Right

## 2022-04-22 DIAGNOSIS — R011 Cardiac murmur, unspecified: Secondary | ICD-10-CM | POA: Diagnosis not present

## 2022-04-22 DIAGNOSIS — Z0000 Encounter for general adult medical examination without abnormal findings: Secondary | ICD-10-CM | POA: Diagnosis not present

## 2022-05-10 DIAGNOSIS — M5416 Radiculopathy, lumbar region: Secondary | ICD-10-CM | POA: Diagnosis not present

## 2022-06-02 ENCOUNTER — Other Ambulatory Visit: Payer: Self-pay | Admitting: Orthopedic Surgery

## 2022-07-15 ENCOUNTER — Ambulatory Visit: Admit: 2022-07-15 | Payer: Self-pay | Admitting: Orthopedic Surgery

## 2022-07-15 SURGERY — TRANSFORAMINAL LUMBAR INTERBODY FUSION (TLIF) WITH PEDICLE SCREW FIXATION 1 LEVEL
Anesthesia: General | Laterality: Right

## 2023-03-23 ENCOUNTER — Other Ambulatory Visit: Payer: Self-pay | Admitting: Orthopedic Surgery

## 2023-03-23 DIAGNOSIS — M545 Low back pain, unspecified: Secondary | ICD-10-CM

## 2023-03-24 ENCOUNTER — Ambulatory Visit
Admission: RE | Admit: 2023-03-24 | Discharge: 2023-03-24 | Disposition: A | Discharge status: Home / Self Care | Payer: Medicaid Other | Source: Ambulatory Visit | Attending: Orthopedic Surgery | Admitting: Orthopedic Surgery

## 2023-03-24 DIAGNOSIS — M545 Low back pain, unspecified: Secondary | ICD-10-CM

## 2023-04-02 ENCOUNTER — Ambulatory Visit
Admission: RE | Admit: 2023-04-02 | Discharge: 2023-04-02 | Disposition: A | Discharge status: Home / Self Care | Payer: Medicaid Other | Source: Ambulatory Visit | Attending: Orthopedic Surgery | Admitting: Orthopedic Surgery

## 2023-04-02 DIAGNOSIS — M545 Low back pain, unspecified: Secondary | ICD-10-CM
